# Patient Record
Sex: Male | Born: 1940 | Race: White | Hispanic: No | Marital: Married | Smoking: Former smoker
Health system: Southern US, Community
[De-identification: ages and names within clinical notes are randomized; demographics above are authoritative.]

## PROBLEM LIST (undated history)

## (undated) DIAGNOSIS — Z860100 Personal history of colon polyps, unspecified: Secondary | ICD-10-CM

## (undated) DIAGNOSIS — D751 Secondary polycythemia: Secondary | ICD-10-CM

## (undated) DIAGNOSIS — Q796 Ehlers-Danlos syndrome, unspecified: Secondary | ICD-10-CM

## (undated) DIAGNOSIS — E349 Endocrine disorder, unspecified: Secondary | ICD-10-CM

## (undated) HISTORY — DX: Personal history of colon polyps, unspecified: Z86.0100

## (undated) HISTORY — PX: SKIN LESION EXCISION: SHX2412

## (undated) HISTORY — DX: Personal history of colonic polyps: Z86.010

## (undated) HISTORY — DX: Endocrine disorder, unspecified: E34.9

## (undated) HISTORY — DX: Ehlers-Danlos syndrome, unspecified: Q79.60

## (undated) HISTORY — DX: Secondary polycythemia: D75.1

---

## 1986-01-11 HISTORY — PX: CHOLECYSTECTOMY: SHX55

## 1990-01-11 HISTORY — PX: APPENDECTOMY: SHX54

## 2011-03-02 DIAGNOSIS — E782 Mixed hyperlipidemia: Secondary | ICD-10-CM | POA: Diagnosis not present

## 2011-03-02 DIAGNOSIS — N529 Male erectile dysfunction, unspecified: Secondary | ICD-10-CM | POA: Diagnosis not present

## 2011-03-02 DIAGNOSIS — Q796 Ehlers-Danlos syndrome, unspecified: Secondary | ICD-10-CM | POA: Diagnosis not present

## 2011-03-02 DIAGNOSIS — E291 Testicular hypofunction: Secondary | ICD-10-CM | POA: Diagnosis not present

## 2011-03-02 DIAGNOSIS — T466X5A Adverse effect of antihyperlipidemic and antiarteriosclerotic drugs, initial encounter: Secondary | ICD-10-CM | POA: Diagnosis not present

## 2011-03-02 DIAGNOSIS — Z79899 Other long term (current) drug therapy: Secondary | ICD-10-CM | POA: Diagnosis not present

## 2011-03-02 DIAGNOSIS — M353 Polymyalgia rheumatica: Secondary | ICD-10-CM | POA: Diagnosis not present

## 2011-03-02 DIAGNOSIS — M79609 Pain in unspecified limb: Secondary | ICD-10-CM | POA: Diagnosis not present

## 2011-03-02 DIAGNOSIS — M2550 Pain in unspecified joint: Secondary | ICD-10-CM | POA: Diagnosis not present

## 2011-03-10 DIAGNOSIS — T466X5A Adverse effect of antihyperlipidemic and antiarteriosclerotic drugs, initial encounter: Secondary | ICD-10-CM | POA: Diagnosis not present

## 2011-03-10 DIAGNOSIS — E291 Testicular hypofunction: Secondary | ICD-10-CM | POA: Diagnosis not present

## 2011-04-21 DIAGNOSIS — B370 Candidal stomatitis: Secondary | ICD-10-CM | POA: Diagnosis not present

## 2011-04-21 DIAGNOSIS — E291 Testicular hypofunction: Secondary | ICD-10-CM | POA: Diagnosis not present

## 2011-05-10 DIAGNOSIS — K1321 Leukoplakia of oral mucosa, including tongue: Secondary | ICD-10-CM | POA: Diagnosis not present

## 2011-05-11 DIAGNOSIS — E291 Testicular hypofunction: Secondary | ICD-10-CM | POA: Diagnosis not present

## 2011-05-25 DIAGNOSIS — E291 Testicular hypofunction: Secondary | ICD-10-CM | POA: Diagnosis not present

## 2011-05-27 DIAGNOSIS — K1321 Leukoplakia of oral mucosa, including tongue: Secondary | ICD-10-CM | POA: Diagnosis not present

## 2011-05-31 DIAGNOSIS — E291 Testicular hypofunction: Secondary | ICD-10-CM | POA: Diagnosis not present

## 2011-05-31 DIAGNOSIS — Q796 Ehlers-Danlos syndrome, unspecified: Secondary | ICD-10-CM | POA: Diagnosis not present

## 2011-06-01 DIAGNOSIS — K1321 Leukoplakia of oral mucosa, including tongue: Secondary | ICD-10-CM | POA: Diagnosis not present

## 2011-06-09 DIAGNOSIS — L439 Lichen planus, unspecified: Secondary | ICD-10-CM | POA: Diagnosis not present

## 2011-07-06 DIAGNOSIS — B370 Candidal stomatitis: Secondary | ICD-10-CM | POA: Diagnosis not present

## 2011-08-17 DIAGNOSIS — L439 Lichen planus, unspecified: Secondary | ICD-10-CM | POA: Diagnosis not present

## 2011-09-14 DIAGNOSIS — Z125 Encounter for screening for malignant neoplasm of prostate: Secondary | ICD-10-CM | POA: Diagnosis not present

## 2011-09-14 DIAGNOSIS — E291 Testicular hypofunction: Secondary | ICD-10-CM | POA: Diagnosis not present

## 2011-09-14 DIAGNOSIS — E782 Mixed hyperlipidemia: Secondary | ICD-10-CM | POA: Diagnosis not present

## 2011-09-14 DIAGNOSIS — Z87891 Personal history of nicotine dependence: Secondary | ICD-10-CM | POA: Diagnosis not present

## 2011-09-14 DIAGNOSIS — M353 Polymyalgia rheumatica: Secondary | ICD-10-CM | POA: Diagnosis not present

## 2011-09-14 DIAGNOSIS — G510 Bell's palsy: Secondary | ICD-10-CM | POA: Diagnosis not present

## 2011-09-14 DIAGNOSIS — Z79899 Other long term (current) drug therapy: Secondary | ICD-10-CM | POA: Diagnosis not present

## 2011-09-14 DIAGNOSIS — Q796 Ehlers-Danlos syndrome, unspecified: Secondary | ICD-10-CM | POA: Diagnosis not present

## 2011-10-18 DIAGNOSIS — H2510 Age-related nuclear cataract, unspecified eye: Secondary | ICD-10-CM | POA: Diagnosis not present

## 2011-11-19 DIAGNOSIS — H2510 Age-related nuclear cataract, unspecified eye: Secondary | ICD-10-CM | POA: Diagnosis not present

## 2011-12-01 DIAGNOSIS — IMO0002 Reserved for concepts with insufficient information to code with codable children: Secondary | ICD-10-CM | POA: Diagnosis not present

## 2011-12-01 DIAGNOSIS — E782 Mixed hyperlipidemia: Secondary | ICD-10-CM | POA: Diagnosis not present

## 2011-12-01 DIAGNOSIS — M353 Polymyalgia rheumatica: Secondary | ICD-10-CM | POA: Diagnosis not present

## 2011-12-01 DIAGNOSIS — Z79899 Other long term (current) drug therapy: Secondary | ICD-10-CM | POA: Diagnosis not present

## 2011-12-01 DIAGNOSIS — Q796 Ehlers-Danlos syndrome, unspecified: Secondary | ICD-10-CM | POA: Diagnosis not present

## 2011-12-01 DIAGNOSIS — E291 Testicular hypofunction: Secondary | ICD-10-CM | POA: Diagnosis not present

## 2011-12-01 DIAGNOSIS — Z87891 Personal history of nicotine dependence: Secondary | ICD-10-CM | POA: Diagnosis not present

## 2011-12-01 DIAGNOSIS — Z23 Encounter for immunization: Secondary | ICD-10-CM | POA: Diagnosis not present

## 2011-12-15 DIAGNOSIS — H2510 Age-related nuclear cataract, unspecified eye: Secondary | ICD-10-CM | POA: Diagnosis not present

## 2012-01-03 DIAGNOSIS — Z125 Encounter for screening for malignant neoplasm of prostate: Secondary | ICD-10-CM | POA: Diagnosis not present

## 2012-01-03 DIAGNOSIS — E291 Testicular hypofunction: Secondary | ICD-10-CM | POA: Diagnosis not present

## 2012-01-03 DIAGNOSIS — Z79899 Other long term (current) drug therapy: Secondary | ICD-10-CM | POA: Diagnosis not present

## 2012-01-03 DIAGNOSIS — Z87891 Personal history of nicotine dependence: Secondary | ICD-10-CM | POA: Diagnosis not present

## 2012-01-03 DIAGNOSIS — M353 Polymyalgia rheumatica: Secondary | ICD-10-CM | POA: Diagnosis not present

## 2012-01-03 DIAGNOSIS — R945 Abnormal results of liver function studies: Secondary | ICD-10-CM | POA: Diagnosis not present

## 2012-01-03 DIAGNOSIS — Q796 Ehlers-Danlos syndrome, unspecified: Secondary | ICD-10-CM | POA: Diagnosis not present

## 2012-01-03 DIAGNOSIS — E782 Mixed hyperlipidemia: Secondary | ICD-10-CM | POA: Diagnosis not present

## 2012-01-10 DIAGNOSIS — Z6825 Body mass index (BMI) 25.0-25.9, adult: Secondary | ICD-10-CM | POA: Diagnosis not present

## 2012-01-10 DIAGNOSIS — D45 Polycythemia vera: Secondary | ICD-10-CM | POA: Diagnosis not present

## 2012-01-10 DIAGNOSIS — Z79899 Other long term (current) drug therapy: Secondary | ICD-10-CM | POA: Diagnosis not present

## 2012-01-10 DIAGNOSIS — Z87891 Personal history of nicotine dependence: Secondary | ICD-10-CM | POA: Diagnosis not present

## 2012-01-20 DIAGNOSIS — H04129 Dry eye syndrome of unspecified lacrimal gland: Secondary | ICD-10-CM | POA: Diagnosis not present

## 2012-01-20 DIAGNOSIS — H2000 Unspecified acute and subacute iridocyclitis: Secondary | ICD-10-CM | POA: Diagnosis not present

## 2012-01-28 DIAGNOSIS — H2000 Unspecified acute and subacute iridocyclitis: Secondary | ICD-10-CM | POA: Diagnosis not present

## 2012-04-12 DIAGNOSIS — E291 Testicular hypofunction: Secondary | ICD-10-CM | POA: Diagnosis not present

## 2012-04-12 DIAGNOSIS — L989 Disorder of the skin and subcutaneous tissue, unspecified: Secondary | ICD-10-CM | POA: Diagnosis not present

## 2012-04-12 DIAGNOSIS — Z79899 Other long term (current) drug therapy: Secondary | ICD-10-CM | POA: Diagnosis not present

## 2012-04-12 DIAGNOSIS — Z6825 Body mass index (BMI) 25.0-25.9, adult: Secondary | ICD-10-CM | POA: Diagnosis not present

## 2012-04-12 DIAGNOSIS — G510 Bell's palsy: Secondary | ICD-10-CM | POA: Diagnosis not present

## 2012-04-12 DIAGNOSIS — E782 Mixed hyperlipidemia: Secondary | ICD-10-CM | POA: Diagnosis not present

## 2012-04-12 DIAGNOSIS — Z87891 Personal history of nicotine dependence: Secondary | ICD-10-CM | POA: Diagnosis not present

## 2012-04-19 DIAGNOSIS — D485 Neoplasm of uncertain behavior of skin: Secondary | ICD-10-CM | POA: Diagnosis not present

## 2012-04-19 DIAGNOSIS — C4441 Basal cell carcinoma of skin of scalp and neck: Secondary | ICD-10-CM | POA: Diagnosis not present

## 2012-04-26 DIAGNOSIS — C4441 Basal cell carcinoma of skin of scalp and neck: Secondary | ICD-10-CM | POA: Diagnosis not present

## 2012-04-26 DIAGNOSIS — C44111 Basal cell carcinoma of skin of unspecified eyelid, including canthus: Secondary | ICD-10-CM | POA: Diagnosis not present

## 2012-09-07 DIAGNOSIS — E291 Testicular hypofunction: Secondary | ICD-10-CM | POA: Diagnosis not present

## 2012-09-25 ENCOUNTER — Ambulatory Visit: Payer: Self-pay | Admitting: Internal Medicine

## 2012-09-25 DIAGNOSIS — Z79899 Other long term (current) drug therapy: Secondary | ICD-10-CM | POA: Diagnosis not present

## 2012-09-25 DIAGNOSIS — Z7982 Long term (current) use of aspirin: Secondary | ICD-10-CM | POA: Diagnosis not present

## 2012-09-25 DIAGNOSIS — D509 Iron deficiency anemia, unspecified: Secondary | ICD-10-CM | POA: Diagnosis not present

## 2012-09-25 DIAGNOSIS — D751 Secondary polycythemia: Secondary | ICD-10-CM | POA: Diagnosis not present

## 2012-09-25 DIAGNOSIS — Q796 Ehlers-Danlos syndrome, unspecified: Secondary | ICD-10-CM | POA: Diagnosis not present

## 2012-09-27 ENCOUNTER — Ambulatory Visit: Payer: Self-pay | Admitting: Internal Medicine

## 2012-09-27 LAB — IRON AND TIBC
Iron Bind.Cap.(Total): 267 ug/dL (ref 250–450)
Iron Saturation: 52 %
Iron: 139 ug/dL (ref 65–175)
Unbound Iron-Bind.Cap.: 128 ug/dL

## 2012-09-27 LAB — CBC CANCER CENTER
Basophil %: 0.6 %
Eosinophil #: 0.5 x10 3/mm (ref 0.0–0.7)
Eosinophil %: 6.4 %
HCT: 55.1 % — ABNORMAL HIGH (ref 40.0–52.0)
HGB: 18.9 g/dL — ABNORMAL HIGH (ref 13.0–18.0)
Lymphocyte #: 1.4 x10 3/mm (ref 1.0–3.6)
Lymphocyte %: 19.2 %
MCH: 32 pg (ref 26.0–34.0)
MCHC: 34.2 g/dL (ref 32.0–36.0)
MCV: 93 fL (ref 80–100)
Monocyte #: 0.9 x10 3/mm (ref 0.2–1.0)
Monocyte %: 11.5 %
Neutrophil %: 62.3 %
Platelet: 168 x10 3/mm (ref 150–440)
RDW: 13.5 % (ref 11.5–14.5)
WBC: 7.5 x10 3/mm (ref 3.8–10.6)

## 2012-09-27 LAB — FERRITIN: Ferritin (ARMC): 59 ng/mL (ref 8–388)

## 2012-10-02 LAB — CANCER CENTER HEMATOCRIT: HCT: 51.2 % (ref 40.0–52.0)

## 2012-10-11 ENCOUNTER — Ambulatory Visit: Payer: Self-pay | Admitting: Internal Medicine

## 2012-10-11 DIAGNOSIS — Z79899 Other long term (current) drug therapy: Secondary | ICD-10-CM | POA: Diagnosis not present

## 2012-10-11 DIAGNOSIS — Q796 Ehlers-Danlos syndrome, unspecified: Secondary | ICD-10-CM | POA: Diagnosis not present

## 2012-10-11 DIAGNOSIS — Z7982 Long term (current) use of aspirin: Secondary | ICD-10-CM | POA: Diagnosis not present

## 2012-10-11 DIAGNOSIS — D751 Secondary polycythemia: Secondary | ICD-10-CM | POA: Diagnosis not present

## 2012-10-16 DIAGNOSIS — Z79899 Other long term (current) drug therapy: Secondary | ICD-10-CM | POA: Diagnosis not present

## 2012-10-16 DIAGNOSIS — D751 Secondary polycythemia: Secondary | ICD-10-CM | POA: Diagnosis not present

## 2012-10-16 LAB — CBC CANCER CENTER
Basophil %: 0.5 %
HCT: 48.6 % (ref 40.0–52.0)
Lymphocyte #: 1.3 x10 3/mm (ref 1.0–3.6)
Lymphocyte %: 22.8 %
MCHC: 34.9 g/dL (ref 32.0–36.0)
MCV: 95 fL (ref 80–100)
Monocyte #: 0.6 x10 3/mm (ref 0.2–1.0)
Monocyte %: 10.1 %
Neutrophil #: 3.5 x10 3/mm (ref 1.4–6.5)
Platelet: 168 x10 3/mm (ref 150–440)
RDW: 14 % (ref 11.5–14.5)

## 2012-10-16 LAB — CREATININE, SERUM
Creatinine: 1.1 mg/dL (ref 0.60–1.30)
EGFR (African American): >60
EGFR (Non-African Amer.): >60

## 2012-10-16 LAB — HEPATIC FUNCTION PANEL A (ARMC)
Albumin: 3.8 g/dL (ref 3.4–5.0)
Bilirubin, Direct: 0.1 mg/dL (ref 0.00–0.20)
Bilirubin,Total: 0.6 mg/dL (ref 0.2–1.0)
SGPT (ALT): 27 U/L (ref 12–78)
Total Protein: 7.1 g/dL (ref 6.4–8.2)

## 2012-10-19 DIAGNOSIS — Z0000 Encounter for general adult medical examination without abnormal findings: Secondary | ICD-10-CM | POA: Diagnosis not present

## 2012-10-19 DIAGNOSIS — Z86010 Personal history of colonic polyps: Secondary | ICD-10-CM | POA: Diagnosis not present

## 2012-10-19 DIAGNOSIS — Z23 Encounter for immunization: Secondary | ICD-10-CM | POA: Diagnosis not present

## 2012-11-11 ENCOUNTER — Ambulatory Visit: Payer: Self-pay | Admitting: Internal Medicine

## 2012-11-11 DIAGNOSIS — D751 Secondary polycythemia: Secondary | ICD-10-CM | POA: Diagnosis not present

## 2012-11-28 LAB — CANCER CENTER HEMATOCRIT: HCT: 46.4 % (ref 40.0–52.0)

## 2012-12-11 ENCOUNTER — Ambulatory Visit: Payer: Self-pay | Admitting: Internal Medicine

## 2012-12-11 DIAGNOSIS — Q796 Ehlers-Danlos syndrome, unspecified: Secondary | ICD-10-CM | POA: Diagnosis not present

## 2012-12-11 DIAGNOSIS — Z7982 Long term (current) use of aspirin: Secondary | ICD-10-CM | POA: Diagnosis not present

## 2012-12-11 DIAGNOSIS — D509 Iron deficiency anemia, unspecified: Secondary | ICD-10-CM | POA: Diagnosis not present

## 2012-12-11 DIAGNOSIS — Z79899 Other long term (current) drug therapy: Secondary | ICD-10-CM | POA: Diagnosis not present

## 2012-12-11 DIAGNOSIS — D751 Secondary polycythemia: Secondary | ICD-10-CM | POA: Diagnosis not present

## 2012-12-19 LAB — CANCER CENTER HEMATOCRIT: HCT: 49.6 % (ref 40.0–52.0)

## 2013-01-09 LAB — FERRITIN: Ferritin (ARMC): 12 ng/mL (ref 8–388)

## 2013-01-09 LAB — IRON AND TIBC: Iron Bind.Cap.(Total): 295 ug/dL (ref 250–450)

## 2013-01-11 ENCOUNTER — Ambulatory Visit: Payer: Self-pay | Admitting: Internal Medicine

## 2013-01-11 DIAGNOSIS — Z7982 Long term (current) use of aspirin: Secondary | ICD-10-CM | POA: Diagnosis not present

## 2013-01-11 DIAGNOSIS — Z79899 Other long term (current) drug therapy: Secondary | ICD-10-CM | POA: Diagnosis not present

## 2013-01-11 DIAGNOSIS — D751 Secondary polycythemia: Secondary | ICD-10-CM | POA: Diagnosis not present

## 2013-02-02 DIAGNOSIS — Z79899 Other long term (current) drug therapy: Secondary | ICD-10-CM | POA: Diagnosis not present

## 2013-02-02 DIAGNOSIS — Z136 Encounter for screening for cardiovascular disorders: Secondary | ICD-10-CM | POA: Diagnosis not present

## 2013-02-02 DIAGNOSIS — E291 Testicular hypofunction: Secondary | ICD-10-CM | POA: Diagnosis not present

## 2013-02-02 DIAGNOSIS — D751 Secondary polycythemia: Secondary | ICD-10-CM | POA: Diagnosis not present

## 2013-02-02 DIAGNOSIS — Z86010 Personal history of colonic polyps: Secondary | ICD-10-CM | POA: Diagnosis not present

## 2013-02-02 DIAGNOSIS — R030 Elevated blood-pressure reading, without diagnosis of hypertension: Secondary | ICD-10-CM | POA: Diagnosis not present

## 2013-02-02 DIAGNOSIS — Z125 Encounter for screening for malignant neoplasm of prostate: Secondary | ICD-10-CM | POA: Diagnosis not present

## 2013-02-02 DIAGNOSIS — Z0000 Encounter for general adult medical examination without abnormal findings: Secondary | ICD-10-CM | POA: Diagnosis not present

## 2013-02-02 DIAGNOSIS — Z23 Encounter for immunization: Secondary | ICD-10-CM | POA: Diagnosis not present

## 2013-02-02 LAB — CBC CANCER CENTER
Basophil #: 0 x10 3/mm (ref 0.0–0.1)
Basophil %: 0.6 %
EOS PCT: 4.3 %
Eosinophil #: 0.3 x10 3/mm (ref 0.0–0.7)
HCT: 49.8 % (ref 40.0–52.0)
HGB: 16.5 g/dL (ref 13.0–18.0)
LYMPHS PCT: 19.5 %
Lymphocyte #: 1.2 x10 3/mm (ref 1.0–3.6)
MCH: 30 pg (ref 26.0–34.0)
MCHC: 33 g/dL (ref 32.0–36.0)
MCV: 91 fL (ref 80–100)
Monocyte #: 0.7 x10 3/mm (ref 0.2–1.0)
Monocyte %: 10.9 %
Neutrophil #: 4.1 x10 3/mm (ref 1.4–6.5)
Neutrophil %: 64.7 %
Platelet: 180 x10 3/mm (ref 150–440)
RBC: 5.49 10*6/uL (ref 4.40–5.90)
RDW: 13.3 % (ref 11.5–14.5)
WBC: 6.3 x10 3/mm (ref 3.8–10.6)

## 2013-02-11 ENCOUNTER — Ambulatory Visit: Payer: Self-pay | Admitting: Internal Medicine

## 2013-02-11 ENCOUNTER — Ambulatory Visit: Payer: Self-pay

## 2013-03-15 ENCOUNTER — Ambulatory Visit: Payer: Self-pay | Admitting: Internal Medicine

## 2013-03-15 DIAGNOSIS — D751 Secondary polycythemia: Secondary | ICD-10-CM | POA: Diagnosis not present

## 2013-03-16 DIAGNOSIS — D751 Secondary polycythemia: Secondary | ICD-10-CM | POA: Diagnosis not present

## 2013-03-16 LAB — CANCER CENTER HEMATOCRIT: HCT: 48.8 % (ref 40.0–52.0)

## 2013-03-29 ENCOUNTER — Ambulatory Visit: Payer: Self-pay | Admitting: Gastroenterology

## 2013-03-29 DIAGNOSIS — K5730 Diverticulosis of large intestine without perforation or abscess without bleeding: Secondary | ICD-10-CM | POA: Diagnosis not present

## 2013-03-29 DIAGNOSIS — Z7982 Long term (current) use of aspirin: Secondary | ICD-10-CM | POA: Diagnosis not present

## 2013-03-29 DIAGNOSIS — Z1211 Encounter for screening for malignant neoplasm of colon: Secondary | ICD-10-CM | POA: Diagnosis not present

## 2013-03-29 DIAGNOSIS — Z860100 Personal history of colon polyps, unspecified: Secondary | ICD-10-CM | POA: Diagnosis not present

## 2013-03-29 DIAGNOSIS — D126 Benign neoplasm of colon, unspecified: Secondary | ICD-10-CM | POA: Diagnosis not present

## 2013-03-29 DIAGNOSIS — Z87891 Personal history of nicotine dependence: Secondary | ICD-10-CM | POA: Diagnosis not present

## 2013-03-29 DIAGNOSIS — Z79899 Other long term (current) drug therapy: Secondary | ICD-10-CM | POA: Diagnosis not present

## 2013-04-02 DIAGNOSIS — D751 Secondary polycythemia: Secondary | ICD-10-CM | POA: Diagnosis not present

## 2013-04-02 LAB — CANCER CENTER HEMATOCRIT: HCT: 46.3 % (ref 40.0–52.0)

## 2013-04-11 ENCOUNTER — Ambulatory Visit: Payer: Self-pay | Admitting: Internal Medicine

## 2013-04-11 DIAGNOSIS — D751 Secondary polycythemia: Secondary | ICD-10-CM | POA: Diagnosis not present

## 2013-04-13 LAB — CANCER CENTER HEMATOCRIT: HCT: 46.2 % (ref 40.0–52.0)

## 2013-04-27 LAB — CANCER CENTER HEMATOCRIT: HCT: 44.8 % (ref 40.0–52.0)

## 2013-05-10 DIAGNOSIS — E7800 Pure hypercholesterolemia, unspecified: Secondary | ICD-10-CM | POA: Diagnosis not present

## 2013-05-10 DIAGNOSIS — Z23 Encounter for immunization: Secondary | ICD-10-CM | POA: Diagnosis not present

## 2013-05-10 DIAGNOSIS — I10 Essential (primary) hypertension: Secondary | ICD-10-CM | POA: Diagnosis not present

## 2013-05-10 DIAGNOSIS — E291 Testicular hypofunction: Secondary | ICD-10-CM | POA: Diagnosis not present

## 2013-05-11 ENCOUNTER — Ambulatory Visit: Payer: Self-pay | Admitting: Internal Medicine

## 2013-05-11 DIAGNOSIS — Z79899 Other long term (current) drug therapy: Secondary | ICD-10-CM | POA: Diagnosis not present

## 2013-05-11 DIAGNOSIS — D751 Secondary polycythemia: Secondary | ICD-10-CM | POA: Diagnosis not present

## 2013-05-11 DIAGNOSIS — Z7982 Long term (current) use of aspirin: Secondary | ICD-10-CM | POA: Diagnosis not present

## 2013-05-11 LAB — CANCER CENTER HEMATOCRIT: HCT: 44.4 % (ref 40.0–52.0)

## 2013-05-25 LAB — IRON AND TIBC
Iron Bind.Cap.(Total): 293 ug/dL (ref 250–450)
Iron Saturation: 23 %
Iron: 66 ug/dL (ref 65–175)
UNBOUND IRON-BIND. CAP.: 227 ug/dL

## 2013-05-25 LAB — FERRITIN: Ferritin (ARMC): 9 ng/mL (ref 8–388)

## 2013-05-25 LAB — CANCER CENTER HEMATOCRIT: HCT: 43.4 % (ref 40.0–52.0)

## 2013-06-11 ENCOUNTER — Ambulatory Visit: Payer: Self-pay | Admitting: Internal Medicine

## 2013-06-11 DIAGNOSIS — Z79899 Other long term (current) drug therapy: Secondary | ICD-10-CM | POA: Diagnosis not present

## 2013-06-11 DIAGNOSIS — D751 Secondary polycythemia: Secondary | ICD-10-CM | POA: Diagnosis not present

## 2013-06-11 DIAGNOSIS — Q796 Ehlers-Danlos syndrome, unspecified: Secondary | ICD-10-CM | POA: Diagnosis not present

## 2013-06-11 DIAGNOSIS — Z7982 Long term (current) use of aspirin: Secondary | ICD-10-CM | POA: Diagnosis not present

## 2013-06-15 DIAGNOSIS — D751 Secondary polycythemia: Secondary | ICD-10-CM | POA: Diagnosis not present

## 2013-06-15 DIAGNOSIS — Z79899 Other long term (current) drug therapy: Secondary | ICD-10-CM | POA: Diagnosis not present

## 2013-06-15 LAB — CBC CANCER CENTER
BASOS PCT: 0.5 %
Basophil #: 0 x10 3/mm (ref 0.0–0.1)
EOS ABS: 0.4 x10 3/mm (ref 0.0–0.7)
EOS PCT: 4.9 %
HCT: 47.7 % (ref 40.0–52.0)
HGB: 15.7 g/dL (ref 13.0–18.0)
LYMPHS ABS: 1.3 x10 3/mm (ref 1.0–3.6)
LYMPHS PCT: 17.2 %
MCH: 30.6 pg (ref 26.0–34.0)
MCHC: 33 g/dL (ref 32.0–36.0)
MCV: 93 fL (ref 80–100)
MONOS PCT: 10 %
Monocyte #: 0.8 x10 3/mm (ref 0.2–1.0)
NEUTROS ABS: 5.3 x10 3/mm (ref 1.4–6.5)
Neutrophil %: 67.4 %
Platelet: 164 x10 3/mm (ref 150–440)
RBC: 5.15 10*6/uL (ref 4.40–5.90)
RDW: 13.9 % (ref 11.5–14.5)
WBC: 7.8 x10 3/mm (ref 3.8–10.6)

## 2013-07-11 ENCOUNTER — Ambulatory Visit: Payer: Self-pay | Admitting: Internal Medicine

## 2013-07-11 DIAGNOSIS — Z79899 Other long term (current) drug therapy: Secondary | ICD-10-CM | POA: Diagnosis not present

## 2013-07-11 DIAGNOSIS — D751 Secondary polycythemia: Secondary | ICD-10-CM | POA: Diagnosis not present

## 2013-07-31 LAB — CANCER CENTER HEMATOCRIT: HCT: 51.6 % (ref 40.0–52.0)

## 2013-08-11 ENCOUNTER — Ambulatory Visit: Payer: Self-pay | Admitting: Internal Medicine

## 2013-08-11 DIAGNOSIS — D751 Secondary polycythemia: Secondary | ICD-10-CM | POA: Diagnosis not present

## 2013-08-11 DIAGNOSIS — Z79899 Other long term (current) drug therapy: Secondary | ICD-10-CM | POA: Diagnosis not present

## 2013-08-21 DIAGNOSIS — H43819 Vitreous degeneration, unspecified eye: Secondary | ICD-10-CM | POA: Diagnosis not present

## 2013-09-07 LAB — CANCER CENTER HEMATOCRIT: HCT: 48.9 % (ref 40.0–52.0)

## 2013-09-11 ENCOUNTER — Ambulatory Visit: Payer: Self-pay | Admitting: Internal Medicine

## 2013-10-18 ENCOUNTER — Ambulatory Visit: Payer: Self-pay | Admitting: Internal Medicine

## 2013-10-18 DIAGNOSIS — Z79899 Other long term (current) drug therapy: Secondary | ICD-10-CM | POA: Diagnosis not present

## 2013-10-18 DIAGNOSIS — D751 Secondary polycythemia: Secondary | ICD-10-CM | POA: Diagnosis not present

## 2013-10-19 DIAGNOSIS — Z79899 Other long term (current) drug therapy: Secondary | ICD-10-CM | POA: Diagnosis not present

## 2013-10-19 DIAGNOSIS — D751 Secondary polycythemia: Secondary | ICD-10-CM | POA: Diagnosis not present

## 2013-10-19 LAB — CANCER CENTER HEMATOCRIT: HCT: 48.8 % (ref 40.0–52.0)

## 2013-11-11 ENCOUNTER — Ambulatory Visit: Payer: Self-pay | Admitting: Internal Medicine

## 2013-11-11 DIAGNOSIS — D751 Secondary polycythemia: Secondary | ICD-10-CM | POA: Diagnosis not present

## 2013-11-27 DIAGNOSIS — Z23 Encounter for immunization: Secondary | ICD-10-CM | POA: Diagnosis not present

## 2013-11-30 LAB — CANCER CENTER HEMATOCRIT: HCT: 46.9 % (ref 40.0–52.0)

## 2013-12-04 DIAGNOSIS — J019 Acute sinusitis, unspecified: Secondary | ICD-10-CM | POA: Diagnosis not present

## 2013-12-11 ENCOUNTER — Ambulatory Visit: Payer: Self-pay | Admitting: Internal Medicine

## 2013-12-11 DIAGNOSIS — D751 Secondary polycythemia: Secondary | ICD-10-CM | POA: Diagnosis not present

## 2013-12-11 DIAGNOSIS — E291 Testicular hypofunction: Secondary | ICD-10-CM | POA: Diagnosis not present

## 2013-12-11 DIAGNOSIS — Z79899 Other long term (current) drug therapy: Secondary | ICD-10-CM | POA: Diagnosis not present

## 2014-01-10 LAB — IRON AND TIBC
IRON BIND. CAP.(TOTAL): 285 ug/dL (ref 250–450)
IRON SATURATION: 47 %
Iron: 135 ug/dL (ref 65–175)
UNBOUND IRON-BIND. CAP.: 150 ug/dL

## 2014-01-10 LAB — FERRITIN: Ferritin (ARMC): 14 ng/mL (ref 8–388)

## 2014-01-10 LAB — CANCER CENTER HEMATOCRIT: HCT: 47.8 % (ref 40.0–52.0)

## 2014-01-11 ENCOUNTER — Ambulatory Visit: Payer: Self-pay | Admitting: Internal Medicine

## 2014-03-15 ENCOUNTER — Ambulatory Visit
Admit: 2014-03-15 | Disposition: A | Discharge status: Home / Self Care | Payer: Self-pay | Attending: Internal Medicine | Admitting: Internal Medicine

## 2014-03-15 DIAGNOSIS — D751 Secondary polycythemia: Secondary | ICD-10-CM | POA: Diagnosis not present

## 2014-03-19 ENCOUNTER — Ambulatory Visit: Payer: Self-pay | Admitting: Family Medicine

## 2014-03-19 DIAGNOSIS — S79921A Unspecified injury of right thigh, initial encounter: Secondary | ICD-10-CM | POA: Diagnosis not present

## 2014-03-19 DIAGNOSIS — L989 Disorder of the skin and subcutaneous tissue, unspecified: Secondary | ICD-10-CM | POA: Diagnosis not present

## 2014-03-19 DIAGNOSIS — M79604 Pain in right leg: Secondary | ICD-10-CM | POA: Diagnosis not present

## 2014-03-19 DIAGNOSIS — S8011XA Contusion of right lower leg, initial encounter: Secondary | ICD-10-CM | POA: Diagnosis not present

## 2014-03-28 DIAGNOSIS — C44719 Basal cell carcinoma of skin of left lower limb, including hip: Secondary | ICD-10-CM | POA: Diagnosis not present

## 2014-03-28 DIAGNOSIS — D485 Neoplasm of uncertain behavior of skin: Secondary | ICD-10-CM | POA: Diagnosis not present

## 2014-03-28 DIAGNOSIS — Z85828 Personal history of other malignant neoplasm of skin: Secondary | ICD-10-CM | POA: Diagnosis not present

## 2014-04-04 DIAGNOSIS — L905 Scar conditions and fibrosis of skin: Secondary | ICD-10-CM | POA: Diagnosis not present

## 2014-04-04 DIAGNOSIS — C44719 Basal cell carcinoma of skin of left lower limb, including hip: Secondary | ICD-10-CM | POA: Diagnosis not present

## 2014-04-12 ENCOUNTER — Ambulatory Visit
Admit: 2014-04-12 | Disposition: A | Discharge status: Home / Self Care | Payer: Self-pay | Attending: Internal Medicine | Admitting: Internal Medicine

## 2014-04-12 DIAGNOSIS — D751 Secondary polycythemia: Secondary | ICD-10-CM | POA: Diagnosis not present

## 2014-04-12 DIAGNOSIS — Z7982 Long term (current) use of aspirin: Secondary | ICD-10-CM | POA: Diagnosis not present

## 2014-04-12 DIAGNOSIS — Z87891 Personal history of nicotine dependence: Secondary | ICD-10-CM | POA: Diagnosis not present

## 2014-04-15 DIAGNOSIS — I10 Essential (primary) hypertension: Secondary | ICD-10-CM | POA: Diagnosis not present

## 2014-04-15 DIAGNOSIS — Z125 Encounter for screening for malignant neoplasm of prostate: Secondary | ICD-10-CM | POA: Diagnosis not present

## 2014-04-15 DIAGNOSIS — D751 Secondary polycythemia: Secondary | ICD-10-CM | POA: Diagnosis not present

## 2014-04-15 DIAGNOSIS — Z0000 Encounter for general adult medical examination without abnormal findings: Secondary | ICD-10-CM | POA: Diagnosis not present

## 2014-04-15 DIAGNOSIS — E291 Testicular hypofunction: Secondary | ICD-10-CM | POA: Diagnosis not present

## 2014-04-15 DIAGNOSIS — E780 Pure hypercholesterolemia: Secondary | ICD-10-CM | POA: Diagnosis not present

## 2014-04-26 DIAGNOSIS — D751 Secondary polycythemia: Secondary | ICD-10-CM | POA: Diagnosis not present

## 2014-04-26 DIAGNOSIS — Z87891 Personal history of nicotine dependence: Secondary | ICD-10-CM | POA: Diagnosis not present

## 2014-04-26 DIAGNOSIS — Z7982 Long term (current) use of aspirin: Secondary | ICD-10-CM | POA: Diagnosis not present

## 2014-04-26 LAB — CBC CANCER CENTER
Basophil #: 0 x10 3/mm (ref 0.0–0.1)
Basophil %: 0.4 %
EOS PCT: 5.9 %
Eosinophil #: 0.4 x10 3/mm (ref 0.0–0.7)
HCT: 48.3 % (ref 40.0–52.0)
HGB: 15.9 g/dL (ref 13.0–18.0)
LYMPHS ABS: 1.4 x10 3/mm (ref 1.0–3.6)
Lymphocyte %: 21.1 %
MCH: 31.1 pg (ref 26.0–34.0)
MCHC: 33 g/dL (ref 32.0–36.0)
MCV: 94 fL (ref 80–100)
Monocyte #: 0.8 x10 3/mm (ref 0.2–1.0)
Monocyte %: 12.4 %
Neutrophil #: 3.9 x10 3/mm (ref 1.4–6.5)
Neutrophil %: 60.2 %
PLATELETS: 163 x10 3/mm (ref 150–440)
RBC: 5.12 10*6/uL (ref 4.40–5.90)
RDW: 14.3 % (ref 11.5–14.5)
WBC: 6.4 x10 3/mm (ref 3.8–10.6)

## 2014-04-29 DIAGNOSIS — Z85828 Personal history of other malignant neoplasm of skin: Secondary | ICD-10-CM | POA: Diagnosis not present

## 2014-04-29 DIAGNOSIS — D225 Melanocytic nevi of trunk: Secondary | ICD-10-CM | POA: Diagnosis not present

## 2014-04-29 DIAGNOSIS — L821 Other seborrheic keratosis: Secondary | ICD-10-CM | POA: Diagnosis not present

## 2014-05-29 DIAGNOSIS — I10 Essential (primary) hypertension: Secondary | ICD-10-CM | POA: Diagnosis not present

## 2014-05-29 DIAGNOSIS — Z1389 Encounter for screening for other disorder: Secondary | ICD-10-CM | POA: Diagnosis not present

## 2014-05-29 DIAGNOSIS — D751 Secondary polycythemia: Secondary | ICD-10-CM | POA: Diagnosis not present

## 2014-05-29 DIAGNOSIS — E291 Testicular hypofunction: Secondary | ICD-10-CM | POA: Diagnosis not present

## 2014-05-29 DIAGNOSIS — Z0000 Encounter for general adult medical examination without abnormal findings: Secondary | ICD-10-CM | POA: Diagnosis not present

## 2014-06-07 ENCOUNTER — Other Ambulatory Visit: Payer: Self-pay

## 2014-06-20 ENCOUNTER — Other Ambulatory Visit: Payer: Self-pay

## 2014-06-20 DIAGNOSIS — D751 Secondary polycythemia: Secondary | ICD-10-CM

## 2014-06-21 ENCOUNTER — Inpatient Hospital Stay: Payer: Medicare Other | Attending: Internal Medicine

## 2014-06-21 ENCOUNTER — Ambulatory Visit: Payer: Medicare Other

## 2014-06-21 DIAGNOSIS — D751 Secondary polycythemia: Secondary | ICD-10-CM | POA: Diagnosis not present

## 2014-06-21 LAB — HEMATOCRIT: HCT: 50.2 % (ref 40.0–52.0)

## 2014-06-23 ENCOUNTER — Telehealth: Payer: Self-pay | Admitting: Family Medicine

## 2014-06-23 DIAGNOSIS — I10 Essential (primary) hypertension: Secondary | ICD-10-CM

## 2014-06-23 NOTE — Telephone Encounter (Signed)
error 

## 2014-07-01 NOTE — Telephone Encounter (Signed)
Please advise patient we need to check renal panel since starting back on lisinopril in May. Please see how BP has been doing since restarting medication. We can check his BP when he picks up labs slip if he likes. Lab order is ready to pick up at front desk. He does not need to be fasting.

## 2014-07-02 DIAGNOSIS — I10 Essential (primary) hypertension: Secondary | ICD-10-CM | POA: Diagnosis not present

## 2014-07-02 NOTE — Telephone Encounter (Signed)
Pt advised as directed below.  He has not check his blood pressure at home, because he says his cuff is not working properly.  However he was recently [facility] the cancer center and his blood pressure was.  132/83  Thanks,   -Patrick French

## 2014-07-02 NOTE — Telephone Encounter (Signed)
Sounds like BP is doing good. Please have him pick up lab slip at front desk this week sometime. Thanks.

## 2014-07-03 LAB — RENAL FUNCTION PANEL
Albumin: 4.3 g/dL (ref 3.5–4.8)
BUN/Creatinine Ratio: 12 (ref 10–22)
BUN: 12 mg/dL (ref 8–27)
CALCIUM: 9.3 mg/dL (ref 8.6–10.2)
CHLORIDE: 101 mmol/L (ref 97–108)
CO2: 25 mmol/L (ref 18–29)
Creatinine, Ser: 0.98 mg/dL (ref 0.76–1.27)
GFR, EST AFRICAN AMERICAN: 87 mL/min/{1.73_m2} (ref >59)
GFR, EST NON AFRICAN AMERICAN: 76 mL/min/{1.73_m2} (ref >59)
Glucose: 79 mg/dL (ref 65–99)
POTASSIUM: 4.7 mmol/L (ref 3.5–5.2)
Phosphorus: 3.6 mg/dL (ref 2.5–4.5)
Sodium: 141 mmol/L (ref 134–144)

## 2014-07-05 ENCOUNTER — Encounter: Payer: Self-pay | Admitting: Family Medicine

## 2014-07-05 ENCOUNTER — Ambulatory Visit (INDEPENDENT_AMBULATORY_CARE_PROVIDER_SITE_OTHER): Payer: Medicare Other | Admitting: Family Medicine

## 2014-07-05 VITALS — BP 110/80 | HR 108 | Temp 99.3°F | Resp 20 | Ht 74.0 in | Wt 188.0 lb

## 2014-07-05 DIAGNOSIS — J309 Allergic rhinitis, unspecified: Secondary | ICD-10-CM | POA: Insufficient documentation

## 2014-07-05 DIAGNOSIS — E7800 Pure hypercholesterolemia, unspecified: Secondary | ICD-10-CM | POA: Insufficient documentation

## 2014-07-05 DIAGNOSIS — A938 Other specified arthropod-borne viral fevers: Secondary | ICD-10-CM | POA: Diagnosis not present

## 2014-07-05 DIAGNOSIS — Z86010 Personal history of colonic polyps: Secondary | ICD-10-CM | POA: Insufficient documentation

## 2014-07-05 DIAGNOSIS — E291 Testicular hypofunction: Secondary | ICD-10-CM | POA: Insufficient documentation

## 2014-07-05 DIAGNOSIS — Q828 Other specified congenital malformations of skin: Secondary | ICD-10-CM | POA: Insufficient documentation

## 2014-07-05 DIAGNOSIS — Q796 Ehlers-Danlos syndrome, unspecified: Secondary | ICD-10-CM

## 2014-07-05 DIAGNOSIS — D751 Secondary polycythemia: Secondary | ICD-10-CM | POA: Insufficient documentation

## 2014-07-05 MED ORDER — DOXYCYCLINE HYCLATE 100 MG PO TABS: 100.0000 mg | ORAL_TABLET | Freq: Two times a day (BID) | ORAL | Status: DC

## 2014-07-05 NOTE — Progress Notes (Signed)
Subjective:     Patient ID: Kalil Woessner, male   DOB: 1940-07-07, 74 y.o.   MRN: [id number]  HPI  Chief Complaint  Patient presents with  . Insect Bite    Tick bite on June 9. Pt has fever, c/o joint/muscle pain, chills and sweats, "slight" abdominal discomfort, and being unsteady on feet.  Reports he is drinking more fluids.   Review of Systems  HENT: Positive for congestion (minimal).   Respiratory: Negative for cough.   Genitourinary: Negative for dysuria.       Objective:   Physical Exam  Constitutional: He appears well-developed and well-nourished. No distress.  Pulmonary/Chest: Breath sounds normal.  Skin:  Tick bite on left posterior thigh with mild inflammatory flare        Assessment:    1. Tick fever - doxycycline (VIBRA-TABS) 100 MG tablet; Take 1 tablet (100 mg total) by mouth 2 (two) times daily.  Dispense: 20 tablet; Refill: 0    Plan:   F/u with primary M.D., Dr. Caryn Section, in 48 hours if not improving.

## 2014-07-05 NOTE — Patient Instructions (Signed)
Follow up with Dr. Caryn Section next week if not improving after 48 hours.

## 2014-07-09 ENCOUNTER — Ambulatory Visit (INDEPENDENT_AMBULATORY_CARE_PROVIDER_SITE_OTHER): Payer: Medicare Other | Admitting: Family Medicine

## 2014-07-09 ENCOUNTER — Encounter: Payer: Self-pay | Admitting: Family Medicine

## 2014-07-09 VITALS — BP 110/72 | HR 68 | Temp 97.9°F | Resp 16 | Ht 74.0 in | Wt 182.0 lb

## 2014-07-09 DIAGNOSIS — R519 Headache, unspecified: Secondary | ICD-10-CM

## 2014-07-09 DIAGNOSIS — R5383 Other fatigue: Secondary | ICD-10-CM

## 2014-07-09 DIAGNOSIS — R51 Headache: Secondary | ICD-10-CM | POA: Diagnosis not present

## 2014-07-09 DIAGNOSIS — A938 Other specified arthropod-borne viral fevers: Secondary | ICD-10-CM

## 2014-07-09 MED ORDER — AMOXICILLIN 500 MG PO CAPS: 1000.0000 mg | ORAL_CAPSULE | Freq: Two times a day (BID) | ORAL | Status: DC

## 2014-07-09 MED ORDER — NABUMETONE 500 MG PO TABS: 1000.0000 mg | ORAL_TABLET | Freq: Every day | ORAL | Status: AC

## 2014-07-09 NOTE — Progress Notes (Signed)
Patient: Patrick French Male    DOB: 1940-06-01   74 y.o.   MRN: [id number] Visit Date: 07/09/2014  Today's Provider: Lelon Huh, MD   Chief Complaint  Patient presents with  . Insect Bite    Pt following up from tick fever dx on 07/05/14   Subjective:    HPI Pt is following up for Tick fever. He states bite was on the back of his leg about June 9th. It was a large tick. He  was seen on 07/05/14 by Mariel Sleet with  fever, joint pains and fatigue. He was started on Doxycycline. He reports that the fever resolved in about 2 days but he still feels bad, has no energy, joint pains are not better. He is not sleeping and he has had a headache for 5 days.     Previous Medications   ASPIRIN 81 MG TABLET    Take by mouth.   DOXYCYCLINE (VIBRA-TABS) 100 MG TABLET    Take 1 tablet (100 mg total) by mouth 2 (two) times daily.   LISINOPRIL (PRINIVIL,ZESTRIL) 10 MG TABLET    Take by mouth.   MULTIPLE VITAMIN PO    Take by mouth.   NEEDLE, DISP, 22 G 22G X 1" MISC       NIACIN 500 MG CR CAPSULE    Take by mouth.   OMEGA-3 FATTY ACIDS (FISH OIL BURP-LESS) 1000 MG CAPS    Take by mouth.   SILDENAFIL (VIAGRA) 100 MG TABLET    Take by mouth.   TESTOSTERONE CYPIONATE (DEPOTESTOSTERONE CYPIONATE) 200 MG/ML INJECTION    Inject into the muscle.    Review of Systems  Constitutional: Positive for appetite change and fatigue.  Musculoskeletal: Positive for arthralgias.  Neurological: Positive for headaches.  See HPI   History  Substance Use Topics  . Smoking status: Former Smoker -- 2.00 packs/day for 13 years    Quit date: 01/10/1970  . Smokeless tobacco: Never Used  . Alcohol Use: Yes     Comment: occasional   Objective:   BP 110/72 mmHg  Pulse 68  Temp(Src) 97.9 F (36.6 C) (Oral)  Resp 16  Ht 6\' 2"  (1.88 m)  Wt 182 lb (82.555 kg)  BMI 23.36 kg/m2  Physical Exam  General Appearance:    Alert, cooperative, no distress  Eyes:    PERRL, conjunctiva/corneas clear, EOM's intact        Lungs:     Clear to auscultation bilaterally, respirations unlabored  Heart:    Regular rate and rhythm  Neurologic:   Awake, alert, oriented x 3. No apparent focal neurological           defect.   Skin:   About 1/2 cm healing lesion back of thigh with no surrounding erythema and no other rashes or lesions.   MS:   FROM of all joints, no swelling or erythema of joints.         Assessment & Plan:     1. Tick fever Fever resolved since starting doxycycline, but constitutional symptoms persist, and having persistent generalized headache as below.  - [place] m[place] spotted fvr abs pnl(IgG+IgM) - Lyme Ab/Western Blot Reflex - amoxicillin (AMOXIL) 500 MG capsule; Take 2 capsules (1,000 mg total) by mouth 2 (two) times daily.  Dispense: 28 capsule; Refill: 0  2. Other fatigue  - Comprehensive metabolic panel - CBC - TSH  3. Acute nonintractable headache, unspecified headache type  - nabumetone (RELAFEN) 500 MG tablet; Take 2  tablets (1,000 mg total) by mouth daily.  Dispense: 20 tablet; Refill: 0  Call if symptoms change or if not rapidly improving.      Lelon Huh, MD  [facility]

## 2014-07-10 LAB — CBC
Hematocrit: 48.5 % (ref 37.5–51.0)
Hemoglobin: 17.1 g/dL (ref 12.6–17.7)
MCH: 31.5 pg (ref 26.6–33.0)
MCHC: 35.3 g/dL (ref 31.5–35.7)
MCV: 90 fL (ref 79–97)
PLATELETS: 222 10*3/uL (ref 150–379)
RBC: 5.42 x10E6/uL (ref 4.14–5.80)
RDW: 13.9 % (ref 12.3–15.4)
WBC: 6.4 10*3/uL (ref 3.4–10.8)

## 2014-07-10 LAB — COMPREHENSIVE METABOLIC PANEL
ALT: 61 IU/L — ABNORMAL HIGH (ref 0–44)
AST: 39 IU/L (ref 0–40)
Albumin/Globulin Ratio: 1.6 (ref 1.1–2.5)
Albumin: 4 g/dL (ref 3.5–4.8)
Alkaline Phosphatase: 131 IU/L — ABNORMAL HIGH (ref 39–117)
BUN/Creatinine Ratio: 16 (ref 10–22)
BUN: 17 mg/dL (ref 8–27)
Bilirubin Total: 0.8 mg/dL (ref 0.0–1.2)
CALCIUM: 9.5 mg/dL (ref 8.6–10.2)
CO2: 25 mmol/L (ref 18–29)
CREATININE: 1.04 mg/dL (ref 0.76–1.27)
Chloride: 98 mmol/L (ref 97–108)
GFR calc Af Amer: 81 mL/min/{1.73_m2} (ref >59)
GFR calc non Af Amer: 70 mL/min/{1.73_m2} (ref >59)
GLOBULIN, TOTAL: 2.5 g/dL (ref 1.5–4.5)
Glucose: 107 mg/dL — ABNORMAL HIGH (ref 65–99)
Potassium: 4.6 mmol/L (ref 3.5–5.2)
Sodium: 139 mmol/L (ref 134–144)
TOTAL PROTEIN: 6.5 g/dL (ref 6.0–8.5)

## 2014-07-10 LAB — TSH: TSH: 1.99 u[IU]/mL (ref 0.450–4.500)

## 2014-07-11 ENCOUNTER — Telehealth: Payer: Self-pay

## 2014-07-11 DIAGNOSIS — A938 Other specified arthropod-borne viral fevers: Secondary | ICD-10-CM

## 2014-07-11 LAB — LYME, WESTERN BLOT, SERUM (REFLEXED)
IGG P18 AB.: ABSENT
IGG P28 AB.: ABSENT
IGG P30 AB.: ABSENT
IGG P45 AB.: ABSENT
IGG P58 AB.: ABSENT
IGG P93 AB.: ABSENT
IgG P23 Ab.: ABSENT
IgG P39 Ab.: ABSENT
IgG P66 Ab.: ABSENT
Lyme IgG Wb: NEGATIVE
Lyme IgM Wb: POSITIVE — AB

## 2014-07-11 LAB — LYME AB/WESTERN BLOT REFLEX
LYME DISEASE AB, QUANT, IGM: 4.46 {index} — AB (ref 0.00–0.79)
Lyme IgG/IgM Ab: 0.94 {ISR} — ABNORMAL HIGH (ref 0.00–0.90)

## 2014-07-11 LAB — ROCKY MTN SPOTTED FVR ABS PNL(IGG+IGM)
RMSF IGG: NEGATIVE
RMSF IgM: 0.28 index (ref 0.00–0.89)

## 2014-07-11 MED ORDER — AMOXICILLIN 500 MG PO CAPS: 1000.0000 mg | ORAL_CAPSULE | Freq: Two times a day (BID) | ORAL | Status: AC

## 2014-07-11 NOTE — Telephone Encounter (Signed)
Patient advised.Patient states his symptoms are starting to improve since starting Amoxicillin.  Refill sent into pharmacy.

## 2014-07-11 NOTE — Telephone Encounter (Signed)
-----   Message from Margarita Rana, MD sent at 07/11/2014  4:05 PM EDT ----- Patient with positive early lyme titers.   Please see how he is feeling on Amoxicillin. Can treat for up to 21 days.  Can call in 2 more weeks of Amoxicillin to make sure completely treated. Thanks

## 2014-07-18 ENCOUNTER — Other Ambulatory Visit: Payer: Self-pay

## 2014-07-18 DIAGNOSIS — D751 Secondary polycythemia: Secondary | ICD-10-CM

## 2014-07-19 ENCOUNTER — Inpatient Hospital Stay: Payer: Medicare Other | Attending: Internal Medicine

## 2014-07-19 ENCOUNTER — Inpatient Hospital Stay: Payer: Medicare Other

## 2014-07-19 ENCOUNTER — Ambulatory Visit: Payer: Self-pay | Admitting: Internal Medicine

## 2014-07-19 DIAGNOSIS — Z79899 Other long term (current) drug therapy: Secondary | ICD-10-CM | POA: Insufficient documentation

## 2014-07-19 DIAGNOSIS — E538 Deficiency of other specified B group vitamins: Secondary | ICD-10-CM | POA: Insufficient documentation

## 2014-07-19 DIAGNOSIS — D751 Secondary polycythemia: Secondary | ICD-10-CM

## 2014-07-19 LAB — HEMATOCRIT: HCT: 47.7 % (ref 40.0–52.0)

## 2014-08-15 ENCOUNTER — Encounter: Payer: Self-pay | Admitting: Family Medicine

## 2014-08-18 ENCOUNTER — Other Ambulatory Visit: Payer: Self-pay | Admitting: Family Medicine

## 2014-08-18 MED ORDER — LISINOPRIL 10 MG PO TABS: 10.0000 mg | ORAL_TABLET | Freq: Every day | ORAL | Status: DC

## 2014-08-30 ENCOUNTER — Inpatient Hospital Stay: Payer: Medicare Other

## 2014-09-06 ENCOUNTER — Other Ambulatory Visit: Payer: Self-pay | Admitting: *Deleted

## 2014-09-06 ENCOUNTER — Other Ambulatory Visit: Payer: Self-pay | Admitting: Internal Medicine

## 2014-09-06 ENCOUNTER — Inpatient Hospital Stay: Payer: Medicare Other

## 2014-09-06 ENCOUNTER — Inpatient Hospital Stay: Payer: Medicare Other | Attending: Internal Medicine

## 2014-09-06 ENCOUNTER — Encounter: Payer: Self-pay | Admitting: *Deleted

## 2014-09-06 VITALS — BP 144/82 | HR 87 | Temp 97.7°F

## 2014-09-06 DIAGNOSIS — Z7982 Long term (current) use of aspirin: Secondary | ICD-10-CM | POA: Insufficient documentation

## 2014-09-06 DIAGNOSIS — Z87891 Personal history of nicotine dependence: Secondary | ICD-10-CM | POA: Insufficient documentation

## 2014-09-06 DIAGNOSIS — D751 Secondary polycythemia: Secondary | ICD-10-CM

## 2014-09-06 HISTORY — DX: Secondary polycythemia: D75.1

## 2014-09-06 LAB — HEMATOCRIT: HCT: 47.4 % (ref 40.0–52.0)

## 2014-10-11 ENCOUNTER — Inpatient Hospital Stay: Payer: Medicare Other | Attending: Internal Medicine

## 2014-10-11 ENCOUNTER — Inpatient Hospital Stay: Payer: Medicare Other

## 2014-10-11 DIAGNOSIS — D751 Secondary polycythemia: Secondary | ICD-10-CM | POA: Insufficient documentation

## 2014-10-11 LAB — HEMATOCRIT: HCT: 47.4 % (ref 40.0–52.0)

## 2014-10-14 DIAGNOSIS — Z961 Presence of intraocular lens: Secondary | ICD-10-CM | POA: Diagnosis not present

## 2014-10-29 DIAGNOSIS — Z85828 Personal history of other malignant neoplasm of skin: Secondary | ICD-10-CM | POA: Diagnosis not present

## 2014-10-29 DIAGNOSIS — D225 Melanocytic nevi of trunk: Secondary | ICD-10-CM | POA: Diagnosis not present

## 2014-10-29 DIAGNOSIS — D2261 Melanocytic nevi of right upper limb, including shoulder: Secondary | ICD-10-CM | POA: Diagnosis not present

## 2014-10-29 DIAGNOSIS — D2272 Melanocytic nevi of left lower limb, including hip: Secondary | ICD-10-CM | POA: Diagnosis not present

## 2014-11-06 ENCOUNTER — Encounter: Payer: Self-pay | Admitting: Family Medicine

## 2014-11-11 ENCOUNTER — Encounter: Payer: Self-pay | Admitting: Family Medicine

## 2014-11-12 ENCOUNTER — Other Ambulatory Visit: Payer: Self-pay | Admitting: *Deleted

## 2014-11-12 NOTE — Telephone Encounter (Signed)
-----   Message -----        From: Charlton Haws        Sent: 11/11/2014   9:25 AM          To: [facility]     Subject: Non-Urgent Medical Question                            Request a flu shot.          Request a refill for testosterone cypionate 200 MG/ML injection and associated syringes and needles.          Review record for possible update of tetanus vaccine.

## 2014-11-12 NOTE — Telephone Encounter (Signed)
No record of any tetanus vaccine. Replied to pt's e-mail. Advised pt to call office to schedule nurse visit for flu shot.

## 2014-11-12 NOTE — Telephone Encounter (Signed)
Requesting refill for testosterone 200 mg/mL injection with syringes and needles?

## 2014-11-13 MED ORDER — TESTOSTERONE CYPIONATE 200 MG/ML IM SOLN: 100.0000 mg | INTRAMUSCULAR | Status: DC

## 2014-11-13 NOTE — Telephone Encounter (Signed)
Please call in testerone.

## 2014-11-13 NOTE — Telephone Encounter (Signed)
Rx called in to pharmacy. 

## 2014-11-22 ENCOUNTER — Inpatient Hospital Stay: Payer: Medicare Other

## 2014-11-22 ENCOUNTER — Inpatient Hospital Stay: Payer: Medicare Other | Attending: Internal Medicine

## 2014-11-22 VITALS — BP 131/80 | HR 84 | Temp 97.5°F | Resp 18

## 2014-11-22 DIAGNOSIS — D751 Secondary polycythemia: Secondary | ICD-10-CM | POA: Insufficient documentation

## 2014-11-22 LAB — IRON AND TIBC
Iron: 44 ug/dL — ABNORMAL LOW (ref 45–182)
SATURATION RATIOS: 16 % — AB (ref 17.9–39.5)
TIBC: 281 ug/dL (ref 250–450)
UIBC: 237 ug/dL

## 2014-11-22 LAB — HEMATOCRIT: HEMATOCRIT: 48 % (ref 40.0–52.0)

## 2014-11-22 LAB — FERRITIN: Ferritin: 12 ng/mL — ABNORMAL LOW (ref 24–336)

## 2014-12-13 ENCOUNTER — Ambulatory Visit (INDEPENDENT_AMBULATORY_CARE_PROVIDER_SITE_OTHER): Payer: Medicare Other | Admitting: Family Medicine

## 2014-12-13 ENCOUNTER — Encounter: Payer: Self-pay | Admitting: Family Medicine

## 2014-12-13 VITALS — BP 118/80 | HR 79 | Temp 98.0°F | Resp 16 | Ht 74.0 in | Wt 194.0 lb

## 2014-12-13 DIAGNOSIS — J069 Acute upper respiratory infection, unspecified: Secondary | ICD-10-CM | POA: Diagnosis not present

## 2014-12-13 MED ORDER — AMOXICILLIN 500 MG PO CAPS: 500.0000 mg | ORAL_CAPSULE | Freq: Three times a day (TID) | ORAL | Status: DC

## 2014-12-13 NOTE — Progress Notes (Signed)
Patient: Patrick French Male    DOB: 08-Aug-1940   74 y.o.   MRN: [id number] Visit Date: 12/13/2014  Today's Provider: Lelon Huh, MD   Chief Complaint  Patient presents with  . Cough   Subjective:    Cough This is a new problem. The current episode started in the past 7 days (12/08/2014). The problem has been gradually worsening. The cough is non-productive. Associated symptoms include nasal congestion and postnasal drip. Pertinent negatives include no chest pain, chills, ear congestion, ear pain, fever, headaches, sore throat, shortness of breath or wheezing. Nothing aggravates the symptoms. Treatments tried: nyquil. The treatment provided mild relief.      No Known Allergies Previous Medications   ASPIRIN 81 MG TABLET    Take by mouth.   DOXYCYCLINE (VIBRA-TABS) 100 MG TABLET    Take 1 tablet (100 mg total) by mouth 2 (two) times daily.   LISINOPRIL (PRINIVIL,ZESTRIL) 10 MG TABLET    Take 1 tablet (10 mg total) by mouth daily.   MULTIPLE VITAMIN PO    Take by mouth.   NEEDLE, DISP, 22 G 22G X 1" MISC       NIACIN 500 MG CR CAPSULE    Take by mouth.   OMEGA-3 FATTY ACIDS (FISH OIL BURP-LESS) 1000 MG CAPS    Take by mouth.   SILDENAFIL (VIAGRA) 100 MG TABLET    Take by mouth.   TESTOSTERONE CYPIONATE (DEPOTESTOSTERONE CYPIONATE) 200 MG/ML INJECTION    Inject 0.5 mLs (100 mg total) into the muscle every 14 (fourteen) days.    Review of Systems  Constitutional: Negative for fever, chills and appetite change.  HENT: Positive for congestion and postnasal drip. Negative for ear discharge, ear pain, sinus pressure and sore throat.   Respiratory: Positive for cough. Negative for chest tightness, shortness of breath and wheezing.   Cardiovascular: Negative for chest pain and palpitations.  Gastrointestinal: Negative for nausea, vomiting and abdominal pain.  Neurological: Negative for headaches.    Social History  Substance Use Topics  . Smoking status: Former Smoker --  2.00 packs/day for 13 years    Quit date: 01/10/1970  . Smokeless tobacco: Never Used  . Alcohol Use: Yes     Comment: occasional   Objective:   BP 118/80 mmHg  Pulse 79  Temp(Src) 98 F (36.7 C) (Oral)  Resp 16  Ht 6\' 2"  (1.88 m)  Wt 194 lb (87.998 kg)  BMI 24.90 kg/m2  SpO2 93%  Physical Exam  General Appearance:    Alert, cooperative, no distress  HENT:   bilateral TM normal without fluid or infection, neck without nodes, sinuses nontender and nasal mucosa pale and congested  Eyes:    PERRL, conjunctiva/corneas clear, EOM's intact       Lungs:     Clear to auscultation bilaterally, respirations unlabored  Heart:    Regular rate and rhythm  Neurologic:   Awake, alert, oriented x 3. No apparent focal neurological           defect.           Assessment & Plan:     1. Upper respiratory infection Counseled regarding signs and symptoms of viral and bacterial respiratory infections. Advised fill prescription for amoxicillin if he develops any sign of bacterial infection, or if current symptoms last longer than 10 days.    He plans on returning after dental procedure next week to get flu vaccine.       Lelon Huh,  MD  [place]

## 2014-12-13 NOTE — Patient Instructions (Signed)
Start taking OTC Allegra (fexofenadine) and Mucinex (guaifenesin)  Do take any OTC decongestants Start taking antibiotic if you start having sinus pain or fever or if you are not improving within 2 days

## 2014-12-25 ENCOUNTER — Other Ambulatory Visit: Payer: Self-pay | Admitting: *Deleted

## 2014-12-27 ENCOUNTER — Encounter: Payer: Self-pay | Admitting: Family Medicine

## 2014-12-27 ENCOUNTER — Ambulatory Visit (INDEPENDENT_AMBULATORY_CARE_PROVIDER_SITE_OTHER): Payer: Medicare Other | Admitting: Family Medicine

## 2014-12-27 VITALS — BP 102/70 | HR 84 | Temp 97.8°F | Resp 16 | Ht 74.0 in | Wt 194.0 lb

## 2014-12-27 DIAGNOSIS — R05 Cough: Secondary | ICD-10-CM

## 2014-12-27 DIAGNOSIS — R059 Cough, unspecified: Secondary | ICD-10-CM

## 2014-12-27 DIAGNOSIS — J069 Acute upper respiratory infection, unspecified: Secondary | ICD-10-CM | POA: Diagnosis not present

## 2014-12-27 MED ORDER — HYDROCODONE-HOMATROPINE 5-1.5 MG/5ML PO SYRP: 5.0000 mL | ORAL_SOLUTION | Freq: Three times a day (TID) | ORAL | Status: DC | PRN

## 2014-12-27 MED ORDER — MONTELUKAST SODIUM 10 MG PO TABS: 10.0000 mg | ORAL_TABLET | Freq: Every day | ORAL | Status: DC

## 2014-12-27 NOTE — Progress Notes (Signed)
Patient: Patrick French Male    DOB: Jun 22, 1940   74 y.o.   MRN: [id number] Visit Date: 12/27/2014  Today's Provider: Lelon Huh, MD   Chief Complaint  Patient presents with  . Sinusitis  . Cough   Subjective:    Cough This is a recurrent problem. The current episode started 1 to 4 weeks ago (12/2014). The problem has been unchanged. The problem occurs constantly. The cough is non-productive. Associated symptoms include postnasal drip and a sore throat. Pertinent negatives include no chest pain, chills, ear pain, fever, shortness of breath or wheezing. The symptoms are aggravated by lying down. He has tried prescription cough suppressant, rest and OTC cough suppressant (amoxicillin, delsym) for the symptoms. The treatment provided mild relief.    Patient was seen in office for URI 12/13/2014; Counseled regarding signs and symptoms of viral and bacterial respiratory infections.  He was given option of started amoxicillin which he started about 5 days ago. Cough is still persistent and non-productive, but has a lot of drainage down the back of his throat which is triggering cough. Is worse when he lays down and is keeping awake at night. Tried OTC Allegra, mucinex and Delsym which didn't help.     No Known Allergies Previous Medications   AMOXICILLIN (AMOXIL) 500 MG CAPSULE    Take 1 capsule (500 mg total) by mouth 3 (three) times daily.   ASPIRIN 81 MG TABLET    Take by mouth.   LISINOPRIL (PRINIVIL,ZESTRIL) 10 MG TABLET    Take 1 tablet (10 mg total) by mouth daily.   MULTIPLE VITAMIN PO    Take by mouth.   NEEDLE, DISP, 22 G 22G X 1" MISC       NIACIN 500 MG CR CAPSULE    Take by mouth.   OMEGA-3 FATTY ACIDS (FISH OIL BURP-LESS) 1000 MG CAPS    Take by mouth.   SILDENAFIL (VIAGRA) 100 MG TABLET    Take by mouth.   SYRINGE-NEEDLE, DISP, 3 ML (B-D SYRINGE/NEEDLE 3CC/22GX1.5) 22G X 1-1/2" 3 ML MISC    by Does not apply route.   TESTOSTERONE CYPIONATE (DEPOTESTOSTERONE  CYPIONATE) 200 MG/ML INJECTION    Inject 0.5 mLs (100 mg total) into the muscle every 14 (fourteen) days.    Review of Systems  Constitutional: Negative for fever, chills and appetite change.  HENT: Positive for congestion, postnasal drip, sneezing and sore throat. Negative for ear discharge and ear pain.   Respiratory: Positive for cough. Negative for chest tightness, shortness of breath and wheezing.   Cardiovascular: Negative for chest pain and palpitations.  Gastrointestinal: Negative for nausea, vomiting and abdominal pain.    Social History  Substance Use Topics  . Smoking status: Former Smoker -- 2.00 packs/day for 13 years    Quit date: 01/10/1970  . Smokeless tobacco: Never Used  . Alcohol Use: Yes     Comment: occasional   Objective:   BP 102/70 mmHg  Pulse 84  Temp(Src) 97.8 F (36.6 C) (Oral)  Resp 16  Ht 6\' 2"  (1.88 m)  Wt 194 lb (87.998 kg)  BMI 24.90 kg/m2  SpO2 95%  Physical Exam  General Appearance:    Alert, cooperative, no distress  HENT:   bilateral TM normal without fluid or infection, neck without nodes, sinuses nontender, post nasal drip noted and nasal mucosa pale and congested  Eyes:    PERRL, conjunctiva/corneas clear, EOM's intact       Lungs:  Clear to auscultation bilaterally, respirations unlabored  Heart:    Regular rate and rhythm  Neurologic:   Awake, alert, oriented x 3. No apparent focal neurological           defect.           Assessment & Plan:      1. Upper respiratory infection   2. Cough  - HYDROcodone-homatropine (HYCODAN) 5-1.5 MG/5ML syrup; Take 5 mLs by mouth every 8 (eight) hours as needed for cough.  Dispense: 120 mL; Refill: 0 - montelukast (SINGULAIR) 10 MG tablet; Take 1 tablet (10 mg total) by mouth at bedtime.  Dispense: 30 tablet; Refill: 0  Call if symptoms change or if not rapidly improving.          Lelon Huh, MD  [facility]

## 2015-01-03 ENCOUNTER — Inpatient Hospital Stay: Payer: Medicare Other

## 2015-01-03 ENCOUNTER — Inpatient Hospital Stay: Payer: Medicare Other | Attending: Internal Medicine

## 2015-01-03 VITALS — BP 112/79 | HR 96 | Temp 97.0°F | Resp 18

## 2015-01-03 DIAGNOSIS — D751 Secondary polycythemia: Secondary | ICD-10-CM | POA: Diagnosis not present

## 2015-01-03 LAB — HEMATOCRIT: HEMATOCRIT: 50.1 % (ref 40.0–52.0)

## 2015-02-04 ENCOUNTER — Other Ambulatory Visit: Payer: Self-pay | Admitting: *Deleted

## 2015-02-04 DIAGNOSIS — D751 Secondary polycythemia: Secondary | ICD-10-CM

## 2015-02-07 ENCOUNTER — Inpatient Hospital Stay: Payer: Medicare Other | Attending: Internal Medicine

## 2015-02-07 ENCOUNTER — Inpatient Hospital Stay: Payer: Medicare Other

## 2015-02-07 ENCOUNTER — Encounter: Payer: Self-pay | Admitting: *Deleted

## 2015-02-07 ENCOUNTER — Other Ambulatory Visit: Payer: Medicare Other

## 2015-02-07 ENCOUNTER — Inpatient Hospital Stay (HOSPITAL_BASED_OUTPATIENT_CLINIC_OR_DEPARTMENT_OTHER): Payer: Medicare Other | Admitting: Internal Medicine

## 2015-02-07 VITALS — BP 159/94 | HR 71 | Temp 97.6°F | Resp 18 | Wt 194.6 lb

## 2015-02-07 DIAGNOSIS — Q796 Ehlers-Danlos syndrome: Secondary | ICD-10-CM | POA: Diagnosis not present

## 2015-02-07 DIAGNOSIS — D751 Secondary polycythemia: Secondary | ICD-10-CM

## 2015-02-07 DIAGNOSIS — Z86010 Personal history of colonic polyps: Secondary | ICD-10-CM | POA: Diagnosis not present

## 2015-02-07 DIAGNOSIS — Z79899 Other long term (current) drug therapy: Secondary | ICD-10-CM | POA: Insufficient documentation

## 2015-02-07 DIAGNOSIS — Z87891 Personal history of nicotine dependence: Secondary | ICD-10-CM | POA: Insufficient documentation

## 2015-02-07 DIAGNOSIS — Z7982 Long term (current) use of aspirin: Secondary | ICD-10-CM | POA: Insufficient documentation

## 2015-02-07 LAB — CBC WITH DIFFERENTIAL/PLATELET
Basophils Absolute: 0.1 10*3/uL (ref 0–0.1)
Basophils Relative: 1 %
EOS PCT: 9 %
Eosinophils Absolute: 0.5 10*3/uL (ref 0–0.7)
HEMATOCRIT: 48.3 % (ref 40.0–52.0)
Hemoglobin: 16.8 g/dL (ref 13.0–18.0)
LYMPHS ABS: 1.3 10*3/uL (ref 1.0–3.6)
LYMPHS PCT: 21 %
MCH: 32.3 pg (ref 26.0–34.0)
MCHC: 34.8 g/dL (ref 32.0–36.0)
MCV: 92.6 fL (ref 80.0–100.0)
MONO ABS: 0.7 10*3/uL (ref 0.2–1.0)
MONOS PCT: 10 %
Neutro Abs: 3.7 10*3/uL (ref 1.4–6.5)
Neutrophils Relative %: 59 %
PLATELETS: 167 10*3/uL (ref 150–440)
RBC: 5.21 MIL/uL (ref 4.40–5.90)
RDW: 13.6 % (ref 11.5–14.5)
WBC: 6.3 10*3/uL (ref 3.8–10.6)

## 2015-02-07 NOTE — Progress Notes (Signed)
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   INTERVAL HISTORY:  This is my first interaction with the patient since I joined the practice September 2016. I reviewed the patient's prior charts/pertinent labs findings are summarized above.   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 Patient has been getting phlebotomies every 6 weeks to keep the hematocrit less than 46.   Patient denies having any headaches or vision changes. Appetite is good. Denies any weight loss or nausea vomiting. Denies any night sweats or fevers.  REVIEW OF SYSTEMS:  A complete 10 point review of system is done which is negative except mentioned above/history of present illness.   PAST MEDICAL HISTORY :  Past Medical History  Diagnosis Date  . 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 Testosterone deficiency   . Ehlers-Danlos disease   . History of colon polyps     PAST SURGICAL HISTORY :   Past Surgical History  Procedure Laterality Date  . Skin lesion excision  05/2012  . Cholecystectomy  1988  . Appendectomy  1992    FAMILY HISTORY :   Family History  Problem Relation Age of Onset  . Lung cancer Mother     died at age 19  . Ovarian cancer Sister     deceased from ovarian cancer  . Heart disease Sister     died at age 28 due to complications from heart surgery  . Pneumonia Father 92    died at age 66 from complication of pneumonia    SOCIAL HISTORY:   Social History  Substance Use Topics  . Smoking status: Former Smoker -- 2.00 packs/day for 13 years    Types: Cigarettes    Quit date: 01/10/1970  . Smokeless tobacco: Never Used  . Alcohol Use: 0.0 oz/week    0  Standard drinks or equivalent per week     Comment: occasional    ALLERGIES:  has No Known Allergies.  MEDICATIONS:  Current Outpatient Prescriptions  Medication Sig Dispense Refill  . aspirin 81 MG tablet Take 81 mg by mouth daily.     Marland Kitchen lisinopril (PRINIVIL,ZESTRIL) 10 MG tablet Take 1 tablet (10 mg total) by mouth daily. 30 tablet 5  . MULTIPLE VITAMIN PO Take 1 tablet by mouth daily.     . niacin 500 MG CR capsule Take 500 mg by mouth at bedtime.     . Omega-3 Fatty Acids (FISH OIL BURP-LESS) 1000 MG CAPS Take 1 capsule by mouth 2 (two) times daily.     . sildenafil (VIAGRA) 100 MG tablet Take 100 mg by mouth as needed.     . testosterone cypionate (DEPOTESTOSTERONE CYPIONATE) 200 MG/ML injection Inject 0.5 mLs (100 mg total) into the muscle every 14 (fourteen) days. 10 mL 1   No current facility-administered medications for this visit.    PHYSICAL EXAMINATION:   BP 159/94 mmHg  Pulse 71  Temp(Src) 97.6 F (36.4 C) (Tympanic)  Resp 18  Wt 194 lb 8.9 oz (88.25 kg)  Filed Weights   02/07/15 1343  Weight: 194 lb 8.9 oz (88.25 kg)    GENERAL: Well-nourished well-developed; Alert, no distress and comfortable.  Alone.  EYES: no pallor or  icterus OROPHARYNX: no thrush or ulceration. NECK: supple, no masses felt LYMPH:  no palpable lymphadenopathy in the cervical, axillary or inguinal regions LUNGS: clear to auscultation and  No wheeze or crackles HEART/CVS: regular rate & rhythm and no murmurs; No lower extremity edema ABDOMEN:abdomen soft, non-tender and normal bowel sounds Musculoskeletal:no cyanosis of digits and no clubbing  PSYCH: alert & oriented x 3 with fluent speech NEURO: no focal motor/sensory deficits SKIN:  no rashes or significant lesions  LABORATORY DATA:  I have reviewed the data as listed    Component Value Date/Time   NA 139 07/09/2014 1048   K 4.6 07/09/2014 1048   CL 98 07/09/2014 1048   CO2 25 07/09/2014 1048   GLUCOSE 107* 07/09/2014 1048    BUN 17 07/09/2014 1048   CREATININE 1.04 07/09/2014 1048   CREATININE 1.10 10/16/2012 1346   CALCIUM 9.5 07/09/2014 1048   PROT 6.5 07/09/2014 1048   PROT 7.1 10/16/2012 1346   ALBUMIN 4.0 07/09/2014 1048   ALBUMIN 3.8 10/16/2012 1346   AST 39 07/09/2014 1048   AST 19 10/16/2012 1346   ALT 61* 07/09/2014 1048   ALT 27 10/16/2012 1346   ALKPHOS 131* 07/09/2014 1048   ALKPHOS 92 10/16/2012 1346   BILITOT 0.8 07/09/2014 1048   BILITOT 0.6 10/16/2012 1346   GFRNONAA 70 07/09/2014 1048   GFRNONAA >60 10/16/2012 1346   GFRAA 81 07/09/2014 1048   GFRAA >60 10/16/2012 1346    No results found for: SPEP, UPEP  Lab Results  Component Value Date   WBC 6.3 02/07/2015   NEUTROABS 3.7 02/07/2015   HGB 16.8 02/07/2015   HCT 48.3 02/07/2015   MCV 92.6 02/07/2015   PLT 167 02/07/2015      Chemistry      Component Value Date/Time   NA 139 07/09/2014 1048   K 4.6 07/09/2014 1048   CL 98 07/09/2014 1048   CO2 25 07/09/2014 1048   BUN 17 07/09/2014 1048   CREATININE 1.04 07/09/2014 1048   CREATININE 1.10 10/16/2012 1346      Component Value Date/Time   CALCIUM 9.5 07/09/2014 1048   ALKPHOS 131* 07/09/2014 1048   ALKPHOS 92 10/16/2012 1346   AST 39 07/09/2014 1048   AST 19 10/16/2012 1346   ALT 61* 07/09/2014 1048   ALT 27 10/16/2012 1346   BILITOT 0.8 07/09/2014 1048   BILITOT 0.6 10/16/2012 1346       RADIOGRAPHIC STUDIES: I have personally reviewed the radiological images as listed and agreed with the findings in the report. No results found.   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 Patient has been getting phlebotomy to keep the hematocrit less than 46 every 6 weeks or so. Patient has not noted any significant improvement clinically pre and post phlebotomy.   # I discussed with the patient that we could stretch the phlebotomies to every 8 weeks; to keep the hematocrit less than 50. I discussed differences between the  primary and secondary polycythemia as in detail. Patient agrees with the plan. Py continues to be on asprin 81mg /d  # Phlebotomy 350 ml every 2 months/with hematocrit; follow-up with me in 6 months.  # 15 minutes face-to-face with the patient discussing the above plan of care; more than 50% of time spent on prognosis/ natural history; counseling and coordination.      Cammie Sickle, MD 02/07/2015 1:56 PM

## 2015-02-14 ENCOUNTER — Other Ambulatory Visit: Payer: Self-pay

## 2015-02-14 ENCOUNTER — Ambulatory Visit: Payer: Self-pay | Admitting: Internal Medicine

## 2015-03-21 ENCOUNTER — Inpatient Hospital Stay: Payer: Medicare Other | Attending: Internal Medicine

## 2015-03-21 ENCOUNTER — Inpatient Hospital Stay: Payer: Medicare Other

## 2015-03-21 VITALS — BP 115/77 | HR 90 | Resp 20

## 2015-03-21 DIAGNOSIS — D751 Secondary polycythemia: Secondary | ICD-10-CM | POA: Diagnosis not present

## 2015-03-21 DIAGNOSIS — Z79899 Other long term (current) drug therapy: Secondary | ICD-10-CM | POA: Diagnosis not present

## 2015-03-21 LAB — HEMATOCRIT: HCT: 49.4 % (ref 40.0–52.0)

## 2015-04-30 DIAGNOSIS — D229 Melanocytic nevi, unspecified: Secondary | ICD-10-CM | POA: Diagnosis not present

## 2015-04-30 DIAGNOSIS — Z1283 Encounter for screening for malignant neoplasm of skin: Secondary | ICD-10-CM | POA: Diagnosis not present

## 2015-04-30 DIAGNOSIS — D2272 Melanocytic nevi of left lower limb, including hip: Secondary | ICD-10-CM | POA: Diagnosis not present

## 2015-04-30 DIAGNOSIS — Z85828 Personal history of other malignant neoplasm of skin: Secondary | ICD-10-CM | POA: Diagnosis not present

## 2015-04-30 DIAGNOSIS — L812 Freckles: Secondary | ICD-10-CM | POA: Diagnosis not present

## 2015-04-30 DIAGNOSIS — I8393 Asymptomatic varicose veins of bilateral lower extremities: Secondary | ICD-10-CM | POA: Diagnosis not present

## 2015-04-30 DIAGNOSIS — D485 Neoplasm of uncertain behavior of skin: Secondary | ICD-10-CM | POA: Diagnosis not present

## 2015-04-30 DIAGNOSIS — D1800 Hemangioma unspecified site: Secondary | ICD-10-CM | POA: Diagnosis not present

## 2015-04-30 DIAGNOSIS — L578 Other skin changes due to chronic exposure to nonionizing radiation: Secondary | ICD-10-CM | POA: Diagnosis not present

## 2015-05-12 ENCOUNTER — Ambulatory Visit (INDEPENDENT_AMBULATORY_CARE_PROVIDER_SITE_OTHER): Payer: Medicare Other | Admitting: Family Medicine

## 2015-05-12 ENCOUNTER — Encounter: Payer: Self-pay | Admitting: Family Medicine

## 2015-05-12 VITALS — BP 124/84 | HR 74 | Temp 97.8°F | Resp 16 | Ht 74.5 in | Wt 196.0 lb

## 2015-05-12 DIAGNOSIS — E7800 Pure hypercholesterolemia, unspecified: Secondary | ICD-10-CM

## 2015-05-12 DIAGNOSIS — N529 Male erectile dysfunction, unspecified: Secondary | ICD-10-CM | POA: Insufficient documentation

## 2015-05-12 DIAGNOSIS — Z23 Encounter for immunization: Secondary | ICD-10-CM

## 2015-05-12 DIAGNOSIS — E291 Testicular hypofunction: Secondary | ICD-10-CM | POA: Diagnosis not present

## 2015-05-12 DIAGNOSIS — D751 Secondary polycythemia: Secondary | ICD-10-CM | POA: Diagnosis not present

## 2015-05-12 DIAGNOSIS — Z125 Encounter for screening for malignant neoplasm of prostate: Secondary | ICD-10-CM | POA: Diagnosis not present

## 2015-05-12 DIAGNOSIS — Z0000 Encounter for general adult medical examination without abnormal findings: Secondary | ICD-10-CM

## 2015-05-12 DIAGNOSIS — I10 Essential (primary) hypertension: Secondary | ICD-10-CM

## 2015-05-12 DIAGNOSIS — Z129 Encounter for screening for malignant neoplasm, site unspecified: Secondary | ICD-10-CM | POA: Diagnosis not present

## 2015-05-12 MED ORDER — LISINOPRIL 10 MG PO TABS: 10.0000 mg | ORAL_TABLET | Freq: Every day | ORAL | Status: DC

## 2015-05-12 MED ORDER — TESTOSTERONE CYPIONATE 200 MG/ML IM SOLN: 100.0000 mg | INTRAMUSCULAR | Status: DC

## 2015-05-12 MED ORDER — SILDENAFIL CITRATE 20 MG PO TABS: ORAL_TABLET | ORAL | Status: DC

## 2015-05-12 NOTE — Progress Notes (Signed)
Patient: Patrick French, Male    DOB: [birth year], 75 y.o.   MRN: [id number] Visit Date: 05/12/2015  Today's Provider: Lelon Huh, MD   Chief Complaint  Patient presents with  . Medicare Wellness  . Hypertension    follow up  . Hyperlipidemia    follow up   Subjective:    Annual wellness visit [name] is a 75 y.o. male. He feels well. He reports exercising daily. He reports he is sleeping well.  -----------------------------------------------------------  Hypertension, follow-up:  BP Readings from Last 3 Encounters:  03/21/15 115/77  02/07/15 159/94  01/03/15 112/79    He was last seen for hypertension 1 years ago.  BP at that visit was 136/100. Management since that visit includes restarting Lisinopril again (patient had been out of medication). He reports good compliance with treatment. He is not having side effects.  He is exercising. He is adherent to low salt diet.   Outside blood pressures are not being checked. He is experiencing none.  Patient denies chest pain, chest pressure/discomfort, claudication, dyspnea, exertional chest pressure/discomfort, fatigue, irregular heart beat, lower extremity edema, near-syncope, orthopnea, palpitations, paroxysmal nocturnal dyspnea, syncope and tachypnea.   Cardiovascular risk factors include advanced age (older than 63 for men, 66 for women) and hypertension.  Use of agents associated with hypertension: NSAIDS.     Weight trend: stable Wt Readings from Last 3 Encounters:  02/07/15 194 lb 8.9 oz (88.25 kg)  12/27/14 194 lb (87.998 kg)  12/13/14 194 lb (87.998 kg)    Current diet: in general, a "healthy" diet    ------------------------------------------------------------------------   Lipid/Cholesterol, Follow-up:   Last seen for this1 years ago.  Management changes since that visit include no changes. . Last Lipid Panel: No results found for: CHOL, TRIG, HDL, CHOLHDL, VLDL, LDLCALC,  LDLDIRECT  Risk factors for vascular disease include hypercholesterolemia and hypertension  He reports good compliance with treatment. He is not having side effects.  Current symptoms include none and have been stable. Weight trend: stable Prior visit with dietician: no Current diet: in general, a "healthy" diet   Current exercise: bicycling  Wt Readings from Last 3 Encounters:  02/07/15 194 lb 8.9 oz (88.25 kg)  12/27/14 194 lb (87.998 kg)  12/13/14 194 lb (87.998 kg)    -------------------------------------------------------------------  Follow up Hypogonadism: Last office visit was 1 year ago and no changes were made. Patient reports good compliance with treatment. He states he feels well, energy level is good, mood is good. He is having ED and requests prescription for generic sildenafil. Has tolerated Viagra well in the past.    Review of Systems  Constitutional: Negative for fever, chills, appetite change and fatigue.  HENT: Negative for congestion, ear pain, hearing loss, nosebleeds and trouble swallowing.   Eyes: Negative for pain and visual disturbance.  Respiratory: Negative for cough, chest tightness and shortness of breath.   Cardiovascular: Negative for chest pain, palpitations and leg swelling.  Gastrointestinal: Negative for nausea, vomiting, abdominal pain, diarrhea, constipation and blood in stool.  Endocrine: Negative for polydipsia, polyphagia and polyuria.  Genitourinary: Positive for frequency and decreased urine volume. Negative for dysuria and flank pain.  Musculoskeletal: Positive for neck pain. Negative for myalgias, back pain, joint swelling, arthralgias and neck stiffness.  Skin: Negative for color change, rash and wound.  Neurological: Negative for dizziness, tremors, seizures, speech difficulty, weakness, light-headedness and headaches.  Hematological: Bruises/bleeds easily.  Psychiatric/Behavioral: Negative for behavioral problems, confusion, sleep  disturbance, dysphoric mood  and decreased concentration. The patient is not nervous/anxious.   All other systems reviewed and are negative.   Social History   Social History  . Marital Status: Married    Spouse Name: N/A  . Number of Children: N/A  . Years of Education: N/A   Occupational History  . Not on file.   Social History Main Topics  . Smoking status: Former Smoker -- 2.00 packs/day for 13 years    Types: Cigarettes    Quit date: 01/10/1970  . Smokeless tobacco: Never Used  . Alcohol Use: 0.0 oz/week    0 Standard drinks or equivalent per week     Comment: occasional  . Drug Use: No  . Sexual Activity: Not on file   Other Topics Concern  . Not on file   Social History Narrative    Past Medical History  Diagnosis Date  . 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 Testosterone deficiency   . Ehlers-Danlos disease   . History of colon polyps      Patient Active Problem List   Diagnosis Date Noted  . 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 Fatigue 07/09/2014  . Allergic rhinitis 07/05/2014  . 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 History of colon polyps 07/05/2014  . 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 Polycythemia 07/05/2014  . Hypercholesterolemia without hypertriglyceridemia 07/05/2014  . Hypertension 06/23/2014    Past Surgical History  Procedure Laterality Date  . Skin lesion excision  05/2012  . Cholecystectomy  1988  . Appendectomy  1992    His family history includes Heart disease in his sister; Lung cancer in his mother; Ovarian cancer in his sister; Pneumonia (age of onset: 80) in his father.    Previous Medications   ASPIRIN 81 MG TABLET    Take 81 mg by mouth daily.    LISINOPRIL (PRINIVIL,ZESTRIL) 10 MG TABLET    Take 1 tablet (10 mg total) by mouth daily.   MULTIPLE VITAMIN PO    Take 1 tablet by mouth daily.    NIACIN 500 MG CR CAPSULE    Take 500 mg by mouth at bedtime.    OMEGA-3 FATTY ACIDS (FISH OIL BURP-LESS) 1000 MG CAPS    Take  1 capsule by mouth 2 (two) times daily.    SILDENAFIL (VIAGRA) 100 MG TABLET    Take 100 mg by mouth as needed.    TESTOSTERONE CYPIONATE (DEPOTESTOSTERONE CYPIONATE) 200 MG/ML INJECTION    Inject 0.5 mLs (100 mg total) into the muscle every 14 (fourteen) days.    Patient Care Team: Birdie Sons, MD as PCP - General (Family Medicine) Cammie Sickle, MD as Consulting Physician (Hematology)     Objective:   Vitals: BP 124/84 mmHg  Pulse 74  Temp(Src) 97.8 F (36.6 C) (Oral)  Resp 16  Ht 6' 2.5" (1.892 m)  Wt 196 lb (88.905 kg)  BMI 24.84 kg/m2  SpO2 96%  Physical Exam   General Appearance:    Alert, cooperative, no distress, appears stated age  Head:    Normocephalic, without obvious abnormality, atraumatic  Eyes:    PERRL, conjunctiva/corneas clear, EOM's intact, fundi    benign, both eyes       Ears:    Normal TM's and external ear canals, both ears  Nose:   Nares normal, septum midline, mucosa normal, no drainage   or sinus tenderness  Throat:   Lips, mucosa, and tongue normal; teeth and gums normal  Neck:   Supple, symmetrical, trachea midline, no  adenopathy;       thyroid:  No enlargement/tenderness/nodules; no carotid   bruit or JVD  Back:     Symmetric, no curvature, ROM normal, no CVA tenderness  Lungs:     Clear to auscultation bilaterally, respirations unlabored  Chest wall:    No tenderness or deformity  Heart:    Regular rate and rhythm, S1 and S2 normal, no murmur, rub   or gallop  Abdomen:     Soft, non-tender, bowel sounds active all four quadrants,    no masses, no organomegaly  Genitalia:    deferred  Rectal:    deferred  Extremities:   Extremities normal, atraumatic, no cyanosis or edema  Pulses:   2+ and symmetric all extremities  Skin:   Skin color, texture, turgor normal, no rashes or lesions  Lymph nodes:   Cervical, supraclavicular, and axillary nodes normal  Neurologic:   CNII-XII intact. Normal strength, sensation and reflexes       throughout    Activities of Daily Living In your present state of health, do you have any difficulty performing the following activities: 05/12/2015  Hearing? Y  Vision? N  Difficulty concentrating or making decisions? N  Walking or climbing stairs? N  Dressing or bathing? N  Doing errands, shopping? N    Fall Risk Assessment Fall Risk  05/12/2015  Falls in the past year? No     Depression Screen PHQ 2/9 Scores 05/12/2015  PHQ - 2 Score 0    Cognitive Testing - 6-CIT  Correct? Score   What year is it? yes 0 0 or 4  What month is it? yes 0 0 or 3  Memorize:    Pia Mau,  42,  [address]  [place],      What time is it? (within 1 hour) yes 0 0 or 3  Count backwards from 20 yes 0 0, 2, or 4  Name the months of the year yes 0 0, 2, or 4  Repeat name & address above no 3 0, 2, 4, 6, 8, or 10       TOTAL SCORE  3/28   Interpretation:  Normal  Normal (0-7) Abnormal (8-28)    Current Exercise Habits: Home exercise routine, Type of exercise: Other - see comments (cycling on mountain bike), Time (Minutes): 30, Frequency (Times/Week): 7, Weekly Exercise (Minutes/Week): 210, Intensity: Moderate Exercise limited by: None identified   Audit-C Alcohol Use Screening  Question Answer Points  How often do you have alcoholic drink? 2-3 times weekly 3  On days you do drink alcohol, how many drinks do you typically consume? 0 0  How oftey will you drink 6 or more in a total? never 0  Total Score:  3   A score of 3 or more in women, and 4 or more in men indicates increased risk for alcohol abuse, EXCEPT if all of the points are from question 1.    Assessment & Plan:     Annual Wellness Visit  Reviewed patient's Family Medical History Reviewed and updated list of patient's medical providers Assessment of cognitive impairment was done Assessed patient's functional ability Established a written schedule for health screening [place] Completed and  Reviewed  Exercise Activities and Dietary recommendations Goals    None      Immunization History  Administered Date(s) Administered  . Pneumococcal Conjugate-13 02/02/2013    Health Maintenance  Topic Date Due  . TETANUS/TDAP  03/16/1959  . COLONOSCOPY  03/16/1990  .  ZOSTAVAX  03/15/2000  . PNA vac Low Risk Adult (2 of 2 - PPSV23) 02/02/2014  . INFLUENZA VACCINE  08/12/2015      Discussed health benefits of physical activity, and encouraged him to engage in regular exercise appropriate for his age and condition.    ------------------------------------------------------------------------------------------------------------ 1. Medicare annual wellness visit, subsequent Generally doing well  2. Hypercholesterolemia without hypertriglyceridemia On Niacin   3. Essential hypertension Well controlled.  Continue current medications.   - EKG 12-Lead - Lipid panel - Renal function panel  4. Polycythemia Continue follow up hematology for prn phlebotomies.   5. Need for pneumococcal vaccination  - Pneumococcal polysaccharide vaccine 23-valent greater than or equal to 2yo subcutaneous/IM  6. Prostate cancer screening  - PSA  7. Hypogonadism in male Currently doing well. Check levels. - Testosterone,Free and Total  8. Erectile dysfunction, unspecified erectile dysfunction type Rx generic sildenafil 20mg

## 2015-05-13 LAB — RENAL FUNCTION PANEL
Albumin: 4.3 g/dL (ref 3.5–4.8)
BUN / CREAT RATIO: 10 (ref 10–24)
BUN: 12 mg/dL (ref 8–27)
CO2: 24 mmol/L (ref 18–29)
CREATININE: 1.17 mg/dL (ref 0.76–1.27)
Calcium: 9.3 mg/dL (ref 8.6–10.2)
Chloride: 100 mmol/L (ref 96–106)
GFR, EST AFRICAN AMERICAN: 70 mL/min/{1.73_m2} (ref >59)
GFR, EST NON AFRICAN AMERICAN: 61 mL/min/{1.73_m2} (ref >59)
Glucose: 100 mg/dL — ABNORMAL HIGH (ref 65–99)
Phosphorus: 3 mg/dL (ref 2.5–4.5)
Potassium: 4.8 mmol/L (ref 3.5–5.2)
SODIUM: 139 mmol/L (ref 134–144)

## 2015-05-13 LAB — LIPID PANEL
CHOL/HDL RATIO: 5.6 ratio — AB (ref 0.0–5.0)
Cholesterol, Total: 192 mg/dL (ref 100–199)
HDL: 34 mg/dL — AB (ref >39)
LDL CALC: 125 mg/dL — AB (ref 0–99)
TRIGLYCERIDES: 166 mg/dL — AB (ref 0–149)
VLDL CHOLESTEROL CAL: 33 mg/dL (ref 5–40)

## 2015-05-13 LAB — TESTOSTERONE,FREE AND TOTAL
Testosterone, Free: 6.4 pg/mL — ABNORMAL LOW (ref 6.6–18.1)
Testosterone: 277 ng/dL — ABNORMAL LOW (ref 348–1197)

## 2015-05-13 LAB — PSA: Prostate Specific Ag, Serum: 1.1 ng/mL (ref 0.0–4.0)

## 2015-05-14 ENCOUNTER — Telehealth: Payer: Self-pay | Admitting: Family Medicine

## 2015-05-14 NOTE — Telephone Encounter (Signed)
Pt returned call from [place].  Please call him back at [phone]  Thanks Con Memos

## 2015-05-15 NOTE — Telephone Encounter (Signed)
-----   Message from Birdie Sons, MD sent at 05/14/2015  7:58 AM EDT ----- Testosterone level is borderline low. Recommend he stay on same dose of testosterone  Injections. Cholesterol is 192, but HDL (good cholesterol) is too low. Current guidelines are to take a statin to reduce risk of heart disease if HDL is low. Recommend he start pravastatin 20mg  daily, #30, rf x 3 and follow up in 3 months. If he takes pravastatin he can stop taking Niacin.

## 2015-05-15 NOTE — Telephone Encounter (Signed)
Pt called back again.  teri

## 2015-05-15 NOTE — Telephone Encounter (Signed)
Patient's wife stated that patient was on a statin years ago and stopped because it was effecting his liver. Remo Lipps wants to know if the pravastatin is safe for him to take now?

## 2015-05-15 NOTE — Telephone Encounter (Signed)
Patient was notified.

## 2015-05-15 NOTE — Telephone Encounter (Signed)
Patient's wife Remo Lipps was notified of results. Expressed understanding.

## 2015-05-15 NOTE — Telephone Encounter (Signed)
Hepatic panel added. Waiting for approval form to be fax.

## 2015-05-15 NOTE — Telephone Encounter (Signed)
In most cases, taking a different statin will not affect liver.  Please have [facility] add hepatic panel to labs drawn last week. If normal would recommend starting pravastatin and checking lipids, and hepatic panel in 6 weeks.

## 2015-05-16 LAB — HEPATIC FUNCTION PANEL
ALBUMIN: 4.4 g/dL (ref 3.5–4.8)
ALT: 16 IU/L (ref 0–44)
AST: 19 IU/L (ref 0–40)
Alkaline Phosphatase: 91 IU/L (ref 39–117)
BILIRUBIN TOTAL: 0.5 mg/dL (ref 0.0–1.2)
BILIRUBIN, DIRECT: 0.15 mg/dL (ref 0.00–0.40)
TOTAL PROTEIN: 6.9 g/dL (ref 6.0–8.5)

## 2015-05-16 LAB — SPECIMEN STATUS REPORT

## 2015-05-16 MED ORDER — PRAVASTATIN SODIUM 20 MG PO TABS: 20.0000 mg | ORAL_TABLET | Freq: Every day | ORAL | Status: DC

## 2015-05-16 NOTE — Telephone Encounter (Signed)
-----   Message from Birdie Sons, MD sent at 05/16/2015  2:05 PM EDT ----- Recent studies have found that niacin only reduces heart attack risk by a few percentage points but pravastatin reduces heart attack risk by up to 40%... regardless of diet. I would only recommend staying on niacin if there are no statins that he can tolerate.

## 2015-05-16 NOTE — Telephone Encounter (Signed)
Patient advised as directed below. Patient agrees to start Pravastatin. RX sent to [facility].

## 2015-05-23 ENCOUNTER — Inpatient Hospital Stay: Payer: Medicare Other | Attending: Hematology and Oncology

## 2015-05-23 ENCOUNTER — Inpatient Hospital Stay: Payer: Medicare Other

## 2015-05-23 VITALS — BP 121/79 | HR 75 | Temp 97.2°F

## 2015-05-23 DIAGNOSIS — D751 Secondary polycythemia: Secondary | ICD-10-CM | POA: Diagnosis not present

## 2015-05-23 DIAGNOSIS — Z79899 Other long term (current) drug therapy: Secondary | ICD-10-CM | POA: Insufficient documentation

## 2015-05-23 LAB — HEMATOCRIT: HEMATOCRIT: 46 % (ref 40.0–52.0)

## 2015-07-13 IMAGING — CR DG FEMUR 2V*R*
1 series · 4 of 4 positions shown · non-contrast
Comparison: None

CLINICAL DATA: RIGHT leg pain, trauma, ran into a [place]w at [employer] 9
days ago

EXAM:
RIGHT FEMUR - 2 VIEW

[Series 1: kdxr femur right · 0.14mm/px · 4 of 4 slices shown]
[im 1/4]
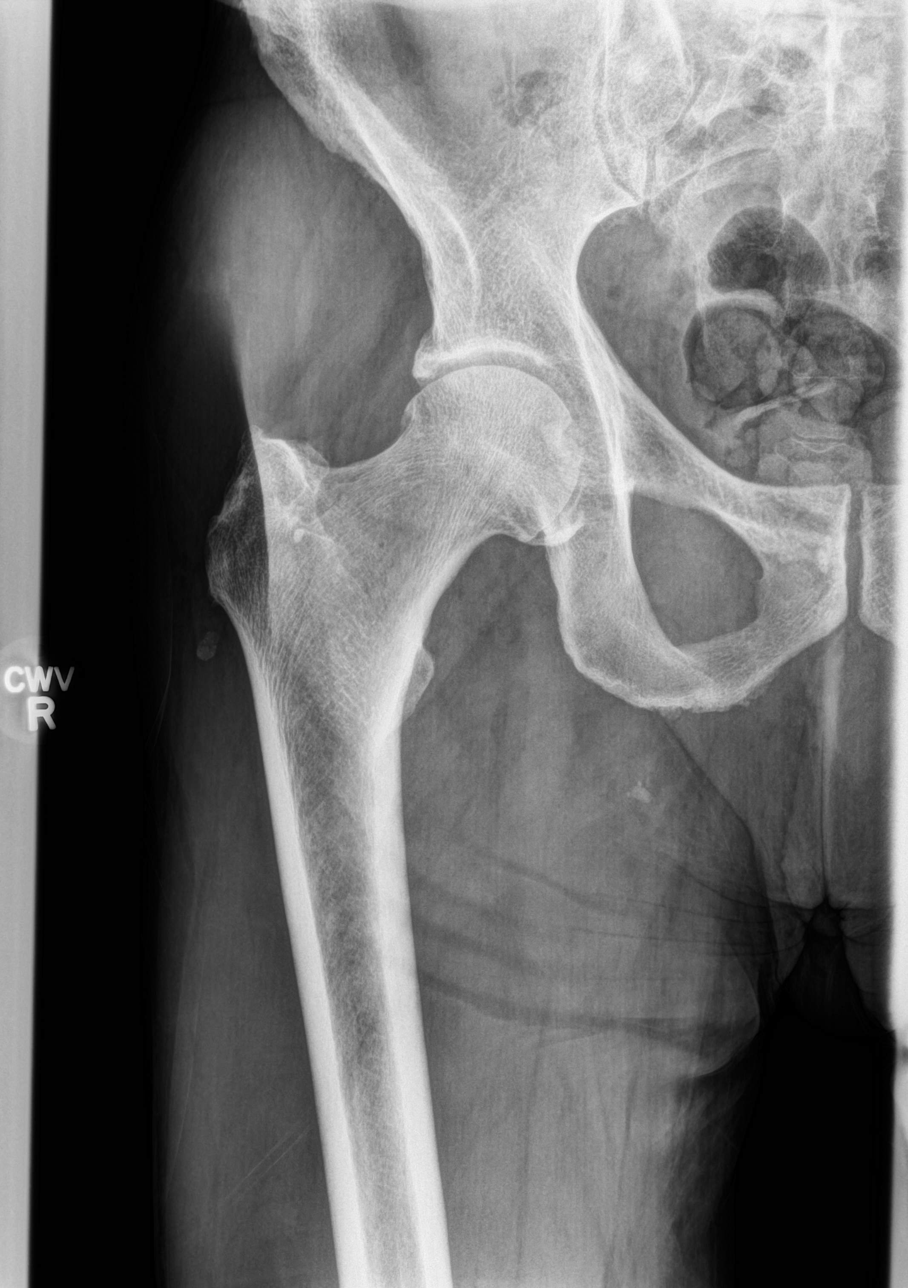
[im 2/4]
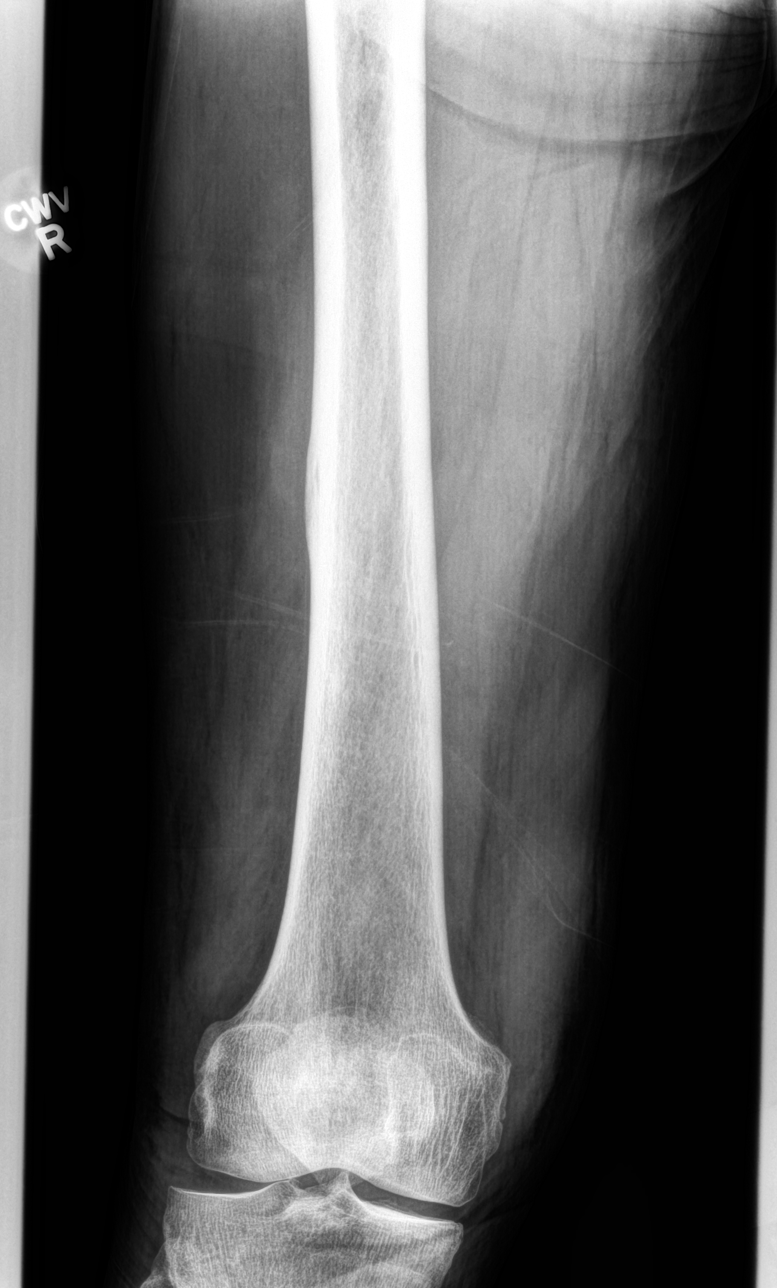
[im 3/4]
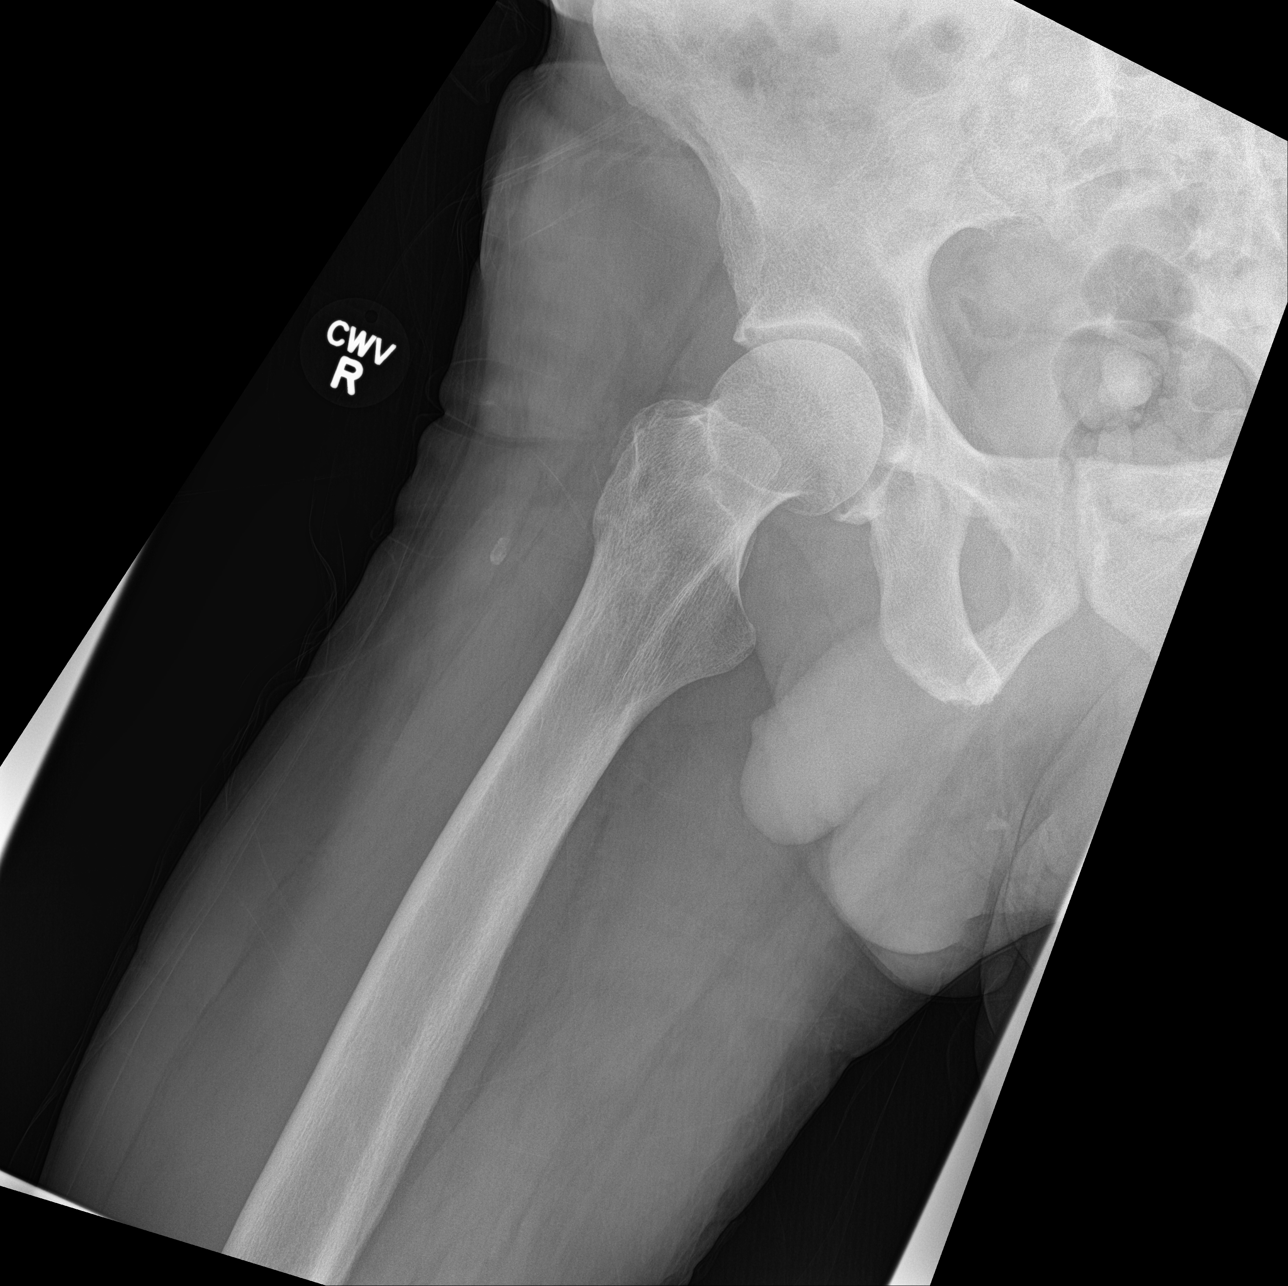
[im 4/4]
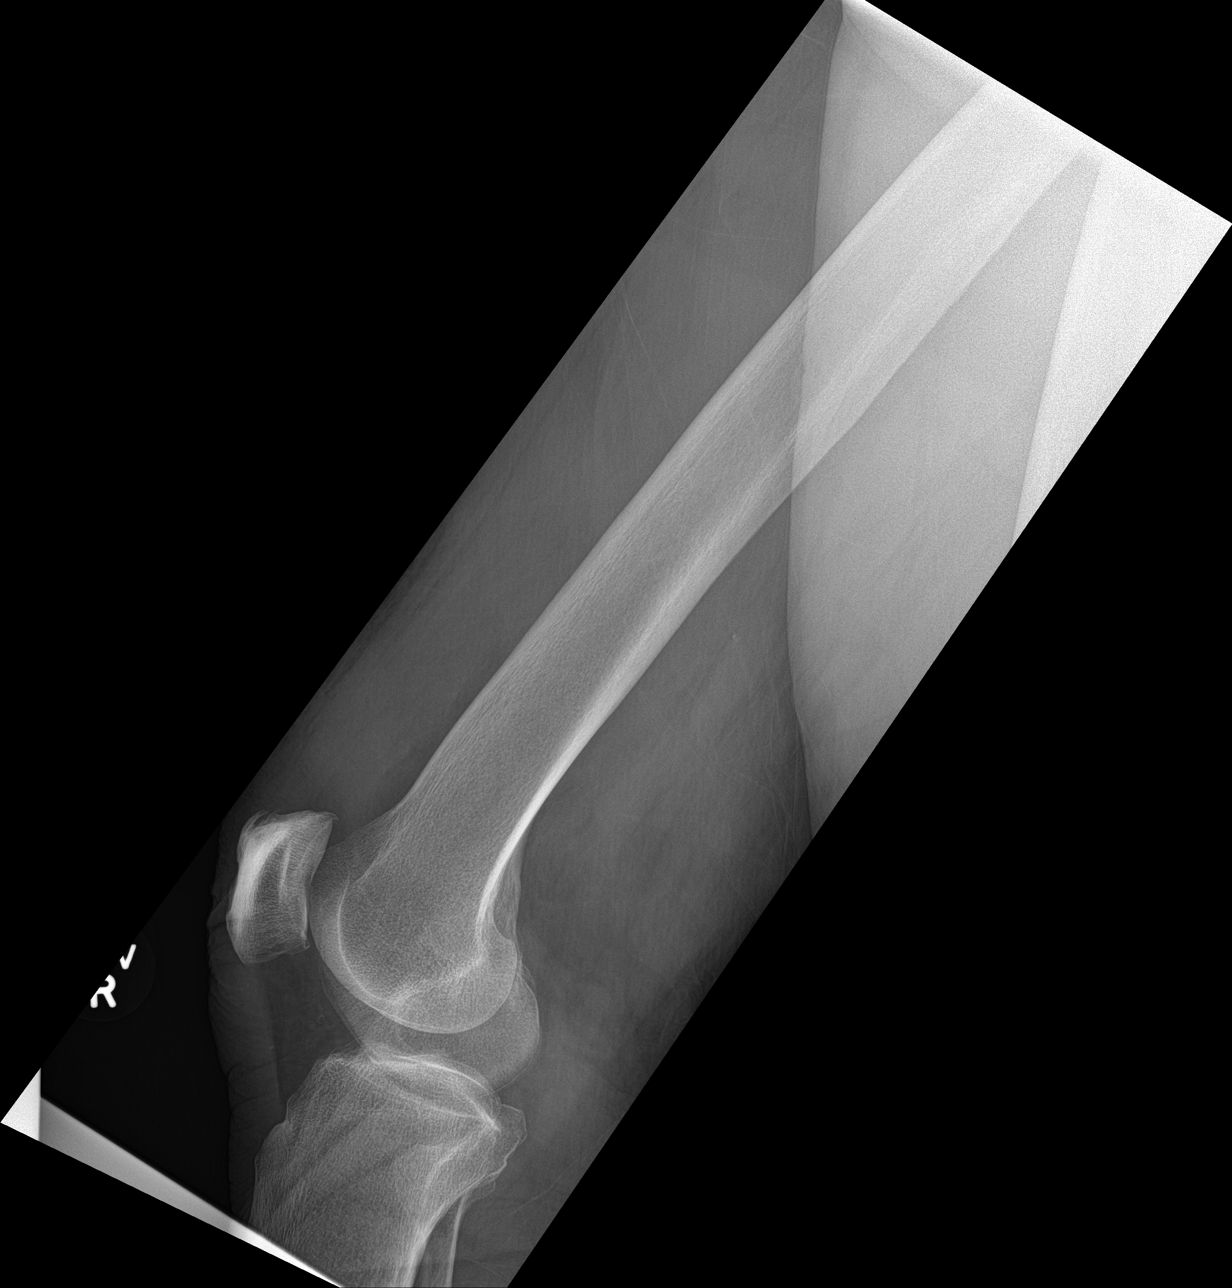

[4 of 4 positions shown; findings below may reference images not displayed]

FINDINGS: Visualized RIGHT pelvis intact.

Osseous mineralization mildly decreased.

No acute fracture, dislocation or bone destruction.
IMPRESSION: No acute osseous abnormalities.

## 2015-07-25 ENCOUNTER — Ambulatory Visit (INDEPENDENT_AMBULATORY_CARE_PROVIDER_SITE_OTHER): Payer: Medicare Other | Admitting: Family Medicine

## 2015-07-25 ENCOUNTER — Inpatient Hospital Stay: Payer: Medicare Other | Admitting: Internal Medicine

## 2015-07-25 ENCOUNTER — Inpatient Hospital Stay: Payer: Medicare Other | Attending: Internal Medicine

## 2015-07-25 ENCOUNTER — Inpatient Hospital Stay: Payer: Medicare Other

## 2015-07-25 VITALS — BP 122/86 | HR 80 | Temp 98.1°F | Resp 16 | Wt 195.0 lb

## 2015-07-25 DIAGNOSIS — D751 Secondary polycythemia: Secondary | ICD-10-CM | POA: Diagnosis not present

## 2015-07-25 DIAGNOSIS — L03115 Cellulitis of right lower limb: Secondary | ICD-10-CM

## 2015-07-25 LAB — CBC WITH DIFFERENTIAL/PLATELET
BASOS PCT: 1 %
Basophils Absolute: 0 10*3/uL (ref 0–0.1)
EOS ABS: 0.5 10*3/uL (ref 0–0.7)
EOS PCT: 6 %
HCT: 47.8 % (ref 40.0–52.0)
Hemoglobin: 16.7 g/dL (ref 13.0–18.0)
LYMPHS ABS: 1.3 10*3/uL (ref 1.0–3.6)
Lymphocytes Relative: 17 %
MCH: 32.7 pg (ref 26.0–34.0)
MCHC: 34.9 g/dL (ref 32.0–36.0)
MCV: 93.6 fL (ref 80.0–100.0)
MONOS PCT: 8 %
Monocytes Absolute: 0.6 10*3/uL (ref 0.2–1.0)
Neutro Abs: 5 10*3/uL (ref 1.4–6.5)
Neutrophils Relative %: 68 %
PLATELETS: 185 10*3/uL (ref 150–440)
RBC: 5.11 MIL/uL (ref 4.40–5.90)
RDW: 13.1 % (ref 11.5–14.5)
WBC: 7.3 10*3/uL (ref 3.8–10.6)

## 2015-07-25 MED ORDER — CEPHALEXIN 500 MG PO CAPS: 500.0000 mg | ORAL_CAPSULE | Freq: Four times a day (QID) | ORAL | Status: AC

## 2015-07-25 NOTE — Progress Notes (Signed)
         Patient: Patrick French Male    DOB: January 05, 1941   75 y.o.   MRN: [id number] Visit Date: 07/25/2015  Today's Provider: Lelon Huh, MD   Chief Complaint  Patient presents with  . Insect Bite   Subjective:    HPI   Pt comes in today reports he had three tick bites on his right leg.  Pt says his wife was able to get them all out from what they can tell.  Two of the bites seem to be healing well, however the third one may be infected.  The bite is swollen and tender, with a minimal amount of puss draining from it.  He reports having Lyme disease last year.       No Known Allergies Current Meds  Medication Sig  . aspirin 81 MG tablet Take 81 mg by mouth daily.   Marland Kitchen lisinopril (PRINIVIL,ZESTRIL) 10 MG tablet Take 1 tablet (10 mg total) by mouth daily.  . MULTIPLE VITAMIN PO Take 1 tablet by mouth daily.   . Omega-3 Fatty Acids (FISH OIL BURP-LESS) 1000 MG CAPS Take 1 capsule by mouth 2 (two) times daily.   . pravastatin (PRAVACHOL) 20 MG tablet Take 1 tablet (20 mg total) by mouth daily.  . sildenafil (REVATIO) 20 MG tablet 3-5 tablets as needed for ED. Do no take more than 5 tablets in a 24 hour period.  Marland Kitchen testosterone cypionate (DEPOTESTOSTERONE CYPIONATE) 200 MG/ML injection Inject 0.5 mLs (100 mg total) into the muscle every 14 (fourteen) days.    Review of Systems  Constitutional: Negative.   Gastrointestinal: Negative.   Musculoskeletal: Positive for neck pain (Has had neck pain since Sunday. ). Negative for myalgias, back pain, joint swelling, arthralgias, gait problem and neck stiffness.  Neurological: Negative for dizziness, light-headedness and headaches.  Hematological: Negative for adenopathy.    Social History  Substance Use Topics  . Smoking status: Former Smoker -- 2.00 packs/day for 13 years    Types: Cigarettes    Quit date: 01/10/1970  . Smokeless tobacco: Never Used  . Alcohol Use: 0.0 oz/week    0 Standard drinks or equivalent per week   Comment: occasional   Objective:   BP 122/86 mmHg  Pulse 80  Temp(Src) 98.1 F (36.7 C) (Oral)  Resp 16  Wt 195 lb (88.451 kg)  Physical Exam  General appearance: alert, well developed, well nourished, cooperative and in no distress Head: Normocephalic, without obvious abnormality, atraumatic  Extremities: about 2.5cm dm patch of tender erythema around site of tick bite posterior aspect of right lower leg. No discharge.  Skin: Skin color, texture, turgor normal. No rashes seen  Psych: Appropriate mood and affect.      Assessment & Plan:     1. Cellulitis of right lower extremity No systemic signs of symptoms of tick borne infection. Start cephalexin and call if not much better next week.  - cephALEXin (KEFLEX) 500 MG capsule; Take 1 capsule (500 mg total) by mouth 4 (four) times daily.  Dispense: 20 capsule; Refill: 0       Lelon Huh, MD  [facility]

## 2015-07-25 NOTE — Progress Notes (Signed)
While I was bringing the patient to infusion for phlebotomy he stated that Dr. B had discussed increasing his goal hct to less than 50 instead of the <46 that is ordered in the computer.  I spoke with Dr. B to inform him of patient's HCT 47.8 as well as what was discussed at last office visit and he have verbal order that patient does not require phlebotomy today.

## 2015-09-09 ENCOUNTER — Other Ambulatory Visit: Payer: Self-pay | Admitting: Family Medicine

## 2015-10-20 ENCOUNTER — Encounter: Payer: Self-pay | Admitting: Internal Medicine

## 2015-10-20 ENCOUNTER — Inpatient Hospital Stay: Payer: Medicare Other | Attending: Internal Medicine | Admitting: Internal Medicine

## 2015-10-20 DIAGNOSIS — Z87891 Personal history of nicotine dependence: Secondary | ICD-10-CM | POA: Diagnosis not present

## 2015-10-20 DIAGNOSIS — Q796 Ehlers-Danlos syndrome: Secondary | ICD-10-CM | POA: Diagnosis not present

## 2015-10-20 DIAGNOSIS — E291 Testicular hypofunction: Secondary | ICD-10-CM | POA: Diagnosis not present

## 2015-10-20 DIAGNOSIS — D751 Secondary polycythemia: Secondary | ICD-10-CM

## 2015-10-20 DIAGNOSIS — Z79899 Other long term (current) drug therapy: Secondary | ICD-10-CM | POA: Diagnosis not present

## 2015-10-20 DIAGNOSIS — Z86010 Personal history of colonic polyps: Secondary | ICD-10-CM | POA: Insufficient documentation

## 2015-10-20 NOTE — Assessment & Plan Note (Addendum)
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 On intermittent phlebotomy. Patient has not noted any significant improvement clinically pre and post phlebotomy.   # I discussed with the patient that we could stretch the phlebotomies to every 12 weeks; to keep the hematocrit less than 50.   # Phlebotomy 350 ml every 3 months/with hematocrit; follow-up with me in 6 months.

## 2015-10-20 NOTE — Progress Notes (Signed)
[facility] OFFICE PROGRESS NOTE  Patient Care Team: Birdie Sons, MD as PCP - General (Family Medicine) Cammie Sickle, MD as Consulting Physician (Hematology)   SUMMARY OF HEMATOLOGIC HISTORY:  # AUG 2014-  ERYTHROCYTOSIS sec to testosterone inc q 2w [epo-N; Jak-2 NEG]; recommend phlebotomy if hct > 50.   INTERVAL HISTORY:  75 year old male patient with above history of erythrocytosis likely from testosterone injections is here for follow-up. Patient has been getting phlebotomies every 6 weeks or so; sees no significant difference pre or post phlebotomies.   Patient denies having any headaches or vision changes. Appetite is good. Denies any weight loss or nausea vomiting. Denies any night sweats or fevers.  REVIEW OF SYSTEMS:  A complete 10 point review of system is done which is negative except mentioned above/history of present illness.   PAST MEDICAL HISTORY :  Past Medical History:  Diagnosis Date  . Ehlers-Danlos disease   . 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 History of colon polyps   . Testosterone deficiency     PAST SURGICAL HISTORY :   Past Surgical History:  Procedure Laterality Date  . APPENDECTOMY  1992  . CHOLECYSTECTOMY  1988  . SKIN LESION EXCISION  05/2012, 04/2014    FAMILY HISTORY :   Family History  Problem Relation Age of Onset  . Lung cancer Mother     died at age 33  . Pneumonia Father 86    died at age 44 from complication of pneumonia  . Ovarian cancer Sister     deceased from ovarian cancer  . Heart disease Sister     died at age 4 due to complications from heart surgery    SOCIAL HISTORY:   Social History  Substance Use Topics  . Smoking status: Former Smoker    Packs/day: 2.00    Years: 13.00    Types: Cigarettes    Quit date: 01/10/1970  . Smokeless tobacco: Never Used  . Alcohol use 0.0 oz/week     Comment: occasional    ALLERGIES:  has No Known  Allergies.  MEDICATIONS:  Current Outpatient Prescriptions  Medication Sig Dispense Refill  . aspirin 81 MG tablet Take 81 mg by mouth daily.     Marland Kitchen lisinopril (PRINIVIL,ZESTRIL) 10 MG tablet Take 1 tablet (10 mg total) by mouth daily. 30 tablet 12  . MULTIPLE VITAMIN PO Take 1 tablet by mouth daily.     . Omega-3 Fatty Acids (FISH OIL BURP-LESS) 1000 MG CAPS Take 1 capsule by mouth 2 (two) times daily.     . pravastatin (PRAVACHOL) 20 MG tablet TAKE 1 TABLET BY MOUTH DAILY 30 tablet 12  . sildenafil (REVATIO) 20 MG tablet 3-5 tablets as needed for ED. Do no take more than 5 tablets in a 24 hour period. 50 tablet 3  . testosterone cypionate (DEPOTESTOSTERONE CYPIONATE) 200 MG/ML injection Inject 0.5 mLs (100 mg total) into the muscle every 14 (fourteen) days. 10 mL 1   No current facility-administered medications for this visit.     PHYSICAL EXAMINATION:   BP (!) 134/95 (BP Location: Left Arm, Patient Position: Sitting)   Pulse 77   Temp (!) 96.5 F (35.8 C) (Tympanic)   Resp 17   Ht 6' 2.5" (1.892 m)   Wt 193 lb 3.2 oz (87.6 kg)   SpO2 94%   BMI 24.47 kg/m   Filed Weights   10/20/15 1422  Weight: 193 lb 3.2 oz (87.6 kg)  GENERAL: Well-nourished well-developed; Alert, no distress and comfortable.  Alone.  EYES: no pallor or icterus OROPHARYNX: no thrush or ulceration. NECK: supple, no masses felt LYMPH:  no palpable lymphadenopathy in the cervical, axillary or inguinal regions LUNGS: clear to auscultation and  No wheeze or crackles HEART/CVS: regular rate & rhythm and no murmurs; No lower extremity edema ABDOMEN:abdomen soft, non-tender and normal bowel sounds Musculoskeletal:no cyanosis of digits and no clubbing  PSYCH: alert & oriented x 3 with fluent speech NEURO: no focal motor/sensory deficits SKIN:  no rashes or significant lesions  LABORATORY DATA:  I have reviewed the data as listed    Component Value Date/Time   NA 139 05/12/2015 1026   K 4.8 05/12/2015  1026   CL 100 05/12/2015 1026   CO2 24 05/12/2015 1026   GLUCOSE 100 (H) 05/12/2015 1026   BUN 12 05/12/2015 1026   CREATININE 1.17 05/12/2015 1026   CREATININE 1.10 10/16/2012 1346   CALCIUM 9.3 05/12/2015 1026   PROT 6.9 05/12/2015 1026   PROT 7.1 10/16/2012 1346   ALBUMIN 4.3 05/12/2015 1026   ALBUMIN 4.4 05/12/2015 1026   ALBUMIN 3.8 10/16/2012 1346   AST 19 05/12/2015 1026   AST 19 10/16/2012 1346   ALT 16 05/12/2015 1026   ALT 27 10/16/2012 1346   ALKPHOS 91 05/12/2015 1026   ALKPHOS 92 10/16/2012 1346   BILITOT 0.5 05/12/2015 1026   BILITOT 0.6 10/16/2012 1346   GFRNONAA 61 05/12/2015 1026   GFRNONAA >60 10/16/2012 1346   GFRAA 70 05/12/2015 1026   GFRAA >60 10/16/2012 1346    No results found for: SPEP, UPEP  Lab Results  Component Value Date   WBC 7.3 07/25/2015   NEUTROABS 5.0 07/25/2015   HGB 16.7 07/25/2015   HCT 47.8 07/25/2015   MCV 93.6 07/25/2015   PLT 185 07/25/2015      Chemistry      Component Value Date/Time   NA 139 05/12/2015 1026   K 4.8 05/12/2015 1026   CL 100 05/12/2015 1026   CO2 24 05/12/2015 1026   BUN 12 05/12/2015 1026   CREATININE 1.17 05/12/2015 1026   CREATININE 1.10 10/16/2012 1346      Component Value Date/Time   CALCIUM 9.3 05/12/2015 1026   ALKPHOS 91 05/12/2015 1026   ALKPHOS 92 10/16/2012 1346   AST 19 05/12/2015 1026   AST 19 10/16/2012 1346   ALT 16 05/12/2015 1026   ALT 27 10/16/2012 1346   BILITOT 0.5 05/12/2015 1026   BILITOT 0.6 10/16/2012 1346       RADIOGRAPHIC STUDIES: I have personally reviewed the radiological images as listed and agreed with the findings in the report. No results found.   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 On intermittent phlebotomy. Patient has not noted any significant improvement clinically pre and post phlebotomy.   # I discussed with the patient that we could stretch the phlebotomies to every 12 weeks; to  keep the hematocrit less than 50.   # Phlebotomy 350 ml every 3 months/with hematocrit; follow-up with me in 6 months.     Cammie Sickle, MD 10/21/2015 8:37 AM

## 2015-10-24 ENCOUNTER — Inpatient Hospital Stay: Payer: Medicare Other

## 2015-10-24 DIAGNOSIS — D751 Secondary polycythemia: Secondary | ICD-10-CM | POA: Diagnosis not present

## 2015-10-24 DIAGNOSIS — Z79899 Other long term (current) drug therapy: Secondary | ICD-10-CM | POA: Diagnosis not present

## 2015-10-24 DIAGNOSIS — Z87891 Personal history of nicotine dependence: Secondary | ICD-10-CM | POA: Diagnosis not present

## 2015-10-24 DIAGNOSIS — Z86010 Personal history of colonic polyps: Secondary | ICD-10-CM | POA: Diagnosis not present

## 2015-10-24 DIAGNOSIS — E291 Testicular hypofunction: Secondary | ICD-10-CM | POA: Diagnosis not present

## 2015-10-24 DIAGNOSIS — Q796 Ehlers-Danlos syndrome: Secondary | ICD-10-CM | POA: Diagnosis not present

## 2015-10-24 LAB — CBC WITH DIFFERENTIAL/PLATELET
BASOS PCT: 1 %
Basophils Absolute: 0 10*3/uL (ref 0–0.1)
Eosinophils Absolute: 0.3 10*3/uL (ref 0–0.7)
Eosinophils Relative: 6 %
HEMATOCRIT: 48.4 % (ref 40.0–52.0)
HEMOGLOBIN: 16.9 g/dL (ref 13.0–18.0)
LYMPHS PCT: 25 %
Lymphs Abs: 1.4 10*3/uL (ref 1.0–3.6)
MCH: 32.6 pg (ref 26.0–34.0)
MCHC: 34.8 g/dL (ref 32.0–36.0)
MCV: 93.5 fL (ref 80.0–100.0)
MONO ABS: 0.6 10*3/uL (ref 0.2–1.0)
MONOS PCT: 11 %
NEUTROS ABS: 3.3 10*3/uL (ref 1.4–6.5)
NEUTROS PCT: 57 %
Platelets: 174 10*3/uL (ref 150–440)
RBC: 5.18 MIL/uL (ref 4.40–5.90)
RDW: 13.2 % (ref 11.5–14.5)
WBC: 5.8 10*3/uL (ref 3.8–10.6)

## 2015-12-09 ENCOUNTER — Other Ambulatory Visit: Payer: Self-pay | Admitting: *Deleted

## 2015-12-09 DIAGNOSIS — E291 Testicular hypofunction: Secondary | ICD-10-CM

## 2015-12-09 MED ORDER — TESTOSTERONE CYPIONATE 200 MG/ML IM SOLN: 100.0000 mg | INTRAMUSCULAR | 5 refills | Status: DC

## 2015-12-09 NOTE — Telephone Encounter (Signed)
Please call in testosterone.  

## 2015-12-10 NOTE — Telephone Encounter (Signed)
Rx called in to pharmacy. 

## 2015-12-30 ENCOUNTER — Telehealth: Payer: Self-pay | Admitting: Family Medicine

## 2015-12-30 NOTE — Telephone Encounter (Signed)
Called Pt to schedule AWV with [facility] for  - knb

## 2016-01-20 ENCOUNTER — Other Ambulatory Visit: Payer: Medicare Other

## 2016-01-23 ENCOUNTER — Inpatient Hospital Stay: Payer: Medicare Other

## 2016-01-23 ENCOUNTER — Inpatient Hospital Stay: Payer: Medicare Other | Attending: Internal Medicine

## 2016-01-23 VITALS — BP 125/79 | HR 85 | Temp 97.1°F | Resp 18

## 2016-01-23 DIAGNOSIS — D751 Secondary polycythemia: Secondary | ICD-10-CM | POA: Diagnosis not present

## 2016-01-23 DIAGNOSIS — Z79899 Other long term (current) drug therapy: Secondary | ICD-10-CM | POA: Diagnosis not present

## 2016-01-23 DIAGNOSIS — E291 Testicular hypofunction: Secondary | ICD-10-CM | POA: Insufficient documentation

## 2016-01-23 LAB — HEMOGLOBIN: Hemoglobin: 17 g/dL (ref 13.0–18.0)

## 2016-01-23 LAB — HEMATOCRIT: HCT: 49.4 % (ref 40.0–52.0)

## 2016-01-23 NOTE — Progress Notes (Signed)
According to last MD note on 10-20-15, phlebotomy parameters were changed to phlebotomy recommended for Hematocrit >50. Hct: 49.4 today. MD, Dr. Rogue Bussing, notified via telephone of Hematocrit and to clarify phlebotomy orders. Per MD order: follow supportive therapy treatment plan order and proceed with phlebotomy of 344ml today.

## 2016-03-12 ENCOUNTER — Telehealth: Payer: Self-pay | Admitting: Family Medicine

## 2016-03-12 NOTE — Telephone Encounter (Signed)
Called Pt to schedule AWV with [facility] - knb °

## 2016-03-30 ENCOUNTER — Encounter: Payer: Self-pay | Admitting: Family Medicine

## 2016-03-30 ENCOUNTER — Ambulatory Visit (INDEPENDENT_AMBULATORY_CARE_PROVIDER_SITE_OTHER): Payer: Medicare Other | Admitting: Family Medicine

## 2016-03-30 VITALS — BP 118/72 | HR 82 | Temp 97.9°F | Resp 16 | Ht 72.0 in | Wt 202.0 lb

## 2016-03-30 DIAGNOSIS — R05 Cough: Secondary | ICD-10-CM | POA: Diagnosis not present

## 2016-03-30 DIAGNOSIS — R059 Cough, unspecified: Secondary | ICD-10-CM

## 2016-03-30 MED ORDER — HYDROCODONE-HOMATROPINE 5-1.5 MG/5ML PO SYRP: 5.0000 mL | ORAL_SOLUTION | Freq: Three times a day (TID) | ORAL | 0 refills | Status: DC | PRN

## 2016-03-30 MED ORDER — MONTELUKAST SODIUM 10 MG PO TABS: 10.0000 mg | ORAL_TABLET | Freq: Every day | ORAL | 0 refills | Status: DC

## 2016-03-30 NOTE — Progress Notes (Signed)
Patient: Patrick French Male    DOB: 1940/06/16   76 y.o.   MRN: [id number] Visit Date: 03/30/2016  Today's Provider: Lelon Huh, MD   Chief Complaint  Patient presents with  . Cough   Subjective:    Cough  This is a new problem. The current episode started in the past 7 days (x 2 days). The problem has been gradually worsening. The cough is productive of sputum. Pertinent negatives include no fever, shortness of breath or wheezing. The symptoms are aggravated by lying down. He has tried OTC cough suppressant for the symptoms. The treatment provided no relief. There is no history of asthma or environmental allergies.       No Known Allergies   Current Outpatient Prescriptions:  .  aspirin 81 MG tablet, Take 81 mg by mouth daily. , Disp: , Rfl:  .  lisinopril (PRINIVIL,ZESTRIL) 10 MG tablet, Take 1 tablet (10 mg total) by mouth daily., Disp: 30 tablet, Rfl: 12 .  MULTIPLE VITAMIN PO, Take 1 tablet by mouth daily. , Disp: , Rfl:  .  Omega-3 Fatty Acids (FISH OIL BURP-LESS) 1000 MG CAPS, Take 1 capsule by mouth 2 (two) times daily. , Disp: , Rfl:  .  pravastatin (PRAVACHOL) 20 MG tablet, TAKE 1 TABLET BY MOUTH DAILY, Disp: 30 tablet, Rfl: 12 .  sildenafil (REVATIO) 20 MG tablet, 3-5 tablets as needed for ED. Do no take more than 5 tablets in a 24 hour period., Disp: 50 tablet, Rfl: 3 .  testosterone cypionate (DEPOTESTOSTERONE CYPIONATE) 200 MG/ML injection, Inject 0.5 mLs (100 mg total) into the muscle every 14 (fourteen) days., Disp: 10 mL, Rfl: 5  Review of Systems  Constitutional: Negative for fever.  Respiratory: Positive for cough. Negative for shortness of breath and wheezing.   Allergic/Immunologic: Negative for environmental allergies.    Social History  Substance Use Topics  . Smoking status: Former Smoker    Packs/day: 2.00    Years: 13.00    Types: Cigarettes    Quit date: 01/10/1970  . Smokeless tobacco: Never Used  . Alcohol use 0.0 oz/week   Comment: occasional   Objective:   BP 118/72 (BP Location: Left Arm, Patient Position: Sitting, Cuff Size: Normal)   Pulse 82   Temp 97.9 F (36.6 C)   Resp 16   Ht 6' (1.829 m)   Wt 202 lb (91.6 kg)   SpO2 98%   BMI 27.40 kg/m     Physical Exam  General Appearance:    Alert, cooperative, no distress  HENT:   bilateral TM normal without fluid or infection, neck without nodes, sinuses nontender, post nasal drip noted and nasal mucosa pale and congested  Eyes:    PERRL, conjunctiva/corneas clear, EOM's intact       Lungs:     Clear to auscultation bilaterally, respirations unlabored  Heart:    Regular rate and rhythm  Neurologic:   Awake, alert, oriented x 3. No apparent focal neurological           defect.           Assessment & Plan:     1. Cough Likely secondary to post nasal drainage.  - HYDROcodone-homatropine (HYCODAN) 5-1.5 MG/5ML syrup; Take 5 mLs by mouth every 8 (eight) hours as needed for cough.  Dispense: 120 mL; Refill: 0 - montelukast (SINGULAIR) 10 MG tablet; Take 1 tablet (10 mg total) by mouth at bedtime.  Dispense: 14 tablet; Refill: 0  Call if  symptoms change or if not rapidly improving.          Lelon Huh, MD  [facility]

## 2016-04-19 ENCOUNTER — Inpatient Hospital Stay: Payer: Medicare Other

## 2016-04-19 ENCOUNTER — Inpatient Hospital Stay: Payer: Medicare Other | Attending: Internal Medicine | Admitting: Internal Medicine

## 2016-04-19 DIAGNOSIS — Z79899 Other long term (current) drug therapy: Secondary | ICD-10-CM | POA: Insufficient documentation

## 2016-04-19 DIAGNOSIS — Z86010 Personal history of colonic polyps: Secondary | ICD-10-CM | POA: Diagnosis not present

## 2016-04-19 DIAGNOSIS — Q796 Ehlers-Danlos syndrome: Secondary | ICD-10-CM | POA: Insufficient documentation

## 2016-04-19 DIAGNOSIS — Z7982 Long term (current) use of aspirin: Secondary | ICD-10-CM | POA: Insufficient documentation

## 2016-04-19 DIAGNOSIS — D751 Secondary polycythemia: Secondary | ICD-10-CM | POA: Insufficient documentation

## 2016-04-19 DIAGNOSIS — Z87891 Personal history of nicotine dependence: Secondary | ICD-10-CM | POA: Insufficient documentation

## 2016-04-19 DIAGNOSIS — E291 Testicular hypofunction: Secondary | ICD-10-CM | POA: Insufficient documentation

## 2016-04-19 LAB — HEMOGLOBIN: HEMOGLOBIN: 17.2 g/dL (ref 13.0–18.0)

## 2016-04-19 LAB — HEMATOCRIT: HCT: 48.8 % (ref 40.0–52.0)

## 2016-04-19 NOTE — Progress Notes (Signed)
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 Patient has been getting phlebotomies every 3 months; sees no significant difference pre or post phlebotomies.  Appetite is good. Denies any weight loss or nausea vomiting. Denies any night sweats or fevers. Patient denies having any headaches or vision changes.  No history of blood clots.   REVIEW OF SYSTEMS:  A complete 10 point review of system is done which is negative except mentioned above/history of present illness.   PAST MEDICAL HISTORY :  Past Medical History:  Diagnosis Date  . Ehlers-Danlos disease   . 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 History of colon polyps   . Testosterone deficiency     PAST SURGICAL HISTORY :   Past Surgical History:  Procedure Laterality Date  . APPENDECTOMY  1992  . CHOLECYSTECTOMY  1988  . SKIN LESION EXCISION  05/2012, 04/2014    FAMILY HISTORY :   Family History  Problem Relation Age of Onset  . Lung cancer Mother     died at age 76  . Pneumonia Father 82    died at age 50 from complication of pneumonia  . Ovarian cancer Sister     deceased from ovarian cancer  . Heart disease Sister     died at age 3 due to complications from heart surgery    SOCIAL HISTORY:   Social History  Substance Use Topics  . Smoking status: Former Smoker    Packs/day: 2.00    Years: 13.00    Types: Cigarettes    Quit date: 01/10/1970  . Smokeless tobacco: Never Used  . Alcohol use 0.0 oz/week     Comment: occasional    ALLERGIES:  has No Known  Allergies.  MEDICATIONS:  Current Outpatient Prescriptions  Medication Sig Dispense Refill  . aspirin 81 MG tablet Take 81 mg by mouth daily.     Marland Kitchen HYDROcodone-homatropine (HYCODAN) 5-1.5 MG/5ML syrup Take 5 mLs by mouth every 8 (eight) hours as needed for cough. 120 mL 0  . lisinopril (PRINIVIL,ZESTRIL) 10 MG tablet Take 1 tablet (10 mg total) by mouth daily. 30 tablet 12  . MULTIPLE VITAMIN PO Take 1 tablet by mouth daily.     . Omega-3 Fatty Acids (FISH OIL BURP-LESS) 1000 MG CAPS Take 1 capsule by mouth 2 (two) times daily.     . pravastatin (PRAVACHOL) 20 MG tablet TAKE 1 TABLET BY MOUTH DAILY 30 tablet 12  . sildenafil (REVATIO) 20 MG tablet 3-5 tablets as needed for ED. Do no take more than 5 tablets in a 24 hour period. 50 tablet 3  . testosterone cypionate (DEPOTESTOSTERONE CYPIONATE) 200 MG/ML injection Inject 0.5 mLs (100 mg total) into the muscle every 14 (fourteen) days. 10 mL 5   No current facility-administered medications for this visit.     PHYSICAL EXAMINATION:   BP 106/69 (BP Location: Left Arm, Patient Position: Sitting)   Pulse 91   Temp 97.7 F (36.5 C) (Tympanic)   Resp 16   Wt 198 lb 6 oz (90 kg)   BMI 26.90 kg/m   Filed  Weights   04/19/16 1403  Weight: 198 lb 6 oz (90 kg)    GENERAL: Well-nourished well-developed; Alert, no distress and comfortable.  Alone.  EYES: no pallor or icterus OROPHARYNX: no thrush or ulceration. NECK: supple, no masses felt LYMPH:  no palpable lymphadenopathy in the cervical, axillary or inguinal regions LUNGS: clear to auscultation and  No wheeze or crackles HEART/CVS: regular rate & rhythm and no murmurs; No lower extremity edema ABDOMEN:abdomen soft, non-tender and normal bowel sounds Musculoskeletal:no cyanosis of digits and no clubbing  PSYCH: alert & oriented x 3 with fluent speech NEURO: no focal motor/sensory deficits SKIN:  no rashes or significant lesions  LABORATORY DATA:  I have reviewed the data as  listed    Component Value Date/Time   NA 139 05/12/2015 1026   K 4.8 05/12/2015 1026   CL 100 05/12/2015 1026   CO2 24 05/12/2015 1026   GLUCOSE 100 (H) 05/12/2015 1026   BUN 12 05/12/2015 1026   CREATININE 1.17 05/12/2015 1026   CREATININE 1.10 10/16/2012 1346   CALCIUM 9.3 05/12/2015 1026   PROT 6.9 05/12/2015 1026   PROT 7.1 10/16/2012 1346   ALBUMIN 4.3 05/12/2015 1026   ALBUMIN 4.4 05/12/2015 1026   ALBUMIN 3.8 10/16/2012 1346   AST 19 05/12/2015 1026   AST 19 10/16/2012 1346   ALT 16 05/12/2015 1026   ALT 27 10/16/2012 1346   ALKPHOS 91 05/12/2015 1026   ALKPHOS 92 10/16/2012 1346   BILITOT 0.5 05/12/2015 1026   BILITOT 0.6 10/16/2012 1346   GFRNONAA 61 05/12/2015 1026   GFRNONAA >60 10/16/2012 1346   GFRAA 70 05/12/2015 1026   GFRAA >60 10/16/2012 1346    No results found for: SPEP, UPEP  Lab Results  Component Value Date   WBC 5.8 10/24/2015   NEUTROABS 3.3 10/24/2015   HGB 17.2 04/19/2016   HCT 48.8 04/19/2016   MCV 93.5 10/24/2015   PLT 174 10/24/2015      Chemistry      Component Value Date/Time   NA 139 05/12/2015 1026   K 4.8 05/12/2015 1026   CL 100 05/12/2015 1026   CO2 24 05/12/2015 1026   BUN 12 05/12/2015 1026   CREATININE 1.17 05/12/2015 1026   CREATININE 1.10 10/16/2012 1346      Component Value Date/Time   CALCIUM 9.3 05/12/2015 1026   ALKPHOS 91 05/12/2015 1026   ALKPHOS 92 10/16/2012 1346   AST 19 05/12/2015 1026   AST 19 10/16/2012 1346   ALT 16 05/12/2015 1026   ALT 27 10/16/2012 1346   BILITOT 0.5 05/12/2015 1026   BILITOT 0.6 10/16/2012 1346       RADIOGRAPHIC STUDIES: I have personally reviewed the radiological images as listed and agreed with the findings in the report. No results found.   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 On intermittent phlebotomy. Patient continues to deny significant improvement clinically pre and post phlebotomy. Today  hemotocrit 48.8- hold phlebotomy.   # I discussed with the patient that we could stretch the phlebotomies to every 12 weeks; to keep the hematocrit less than 49.   # Phlebotomy 350 ml every 3 months/with hematocrit [if Hct > 49]; follow-up with me in 6 months.     Cammie Sickle, MD 04/19/2016 5:07 PM

## 2016-04-19 NOTE — Progress Notes (Signed)
Patient here today for follow up.  Patient states no new concerns today  

## 2016-04-19 NOTE — Assessment & Plan Note (Addendum)
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 On intermittent phlebotomy. Patient continues to deny significant improvement clinically pre and post phlebotomy. Today hemotocrit 48.8- hold phlebotomy.   # I discussed with the patient that we could stretch the phlebotomies to every 12 weeks; to keep the hematocrit less than 49.   # Phlebotomy 350 ml every 3 months/with hematocrit [if Hct > 49]; follow-up with me in 6 months.

## 2016-05-06 DIAGNOSIS — L820 Inflamed seborrheic keratosis: Secondary | ICD-10-CM | POA: Diagnosis not present

## 2016-05-06 DIAGNOSIS — Z85828 Personal history of other malignant neoplasm of skin: Secondary | ICD-10-CM | POA: Diagnosis not present

## 2016-05-06 DIAGNOSIS — L821 Other seborrheic keratosis: Secondary | ICD-10-CM | POA: Diagnosis not present

## 2016-05-06 DIAGNOSIS — D1800 Hemangioma unspecified site: Secondary | ICD-10-CM | POA: Diagnosis not present

## 2016-05-06 DIAGNOSIS — D229 Melanocytic nevi, unspecified: Secondary | ICD-10-CM | POA: Diagnosis not present

## 2016-05-06 DIAGNOSIS — I8393 Asymptomatic varicose veins of bilateral lower extremities: Secondary | ICD-10-CM | POA: Diagnosis not present

## 2016-05-06 DIAGNOSIS — L578 Other skin changes due to chronic exposure to nonionizing radiation: Secondary | ICD-10-CM | POA: Diagnosis not present

## 2016-05-06 DIAGNOSIS — D485 Neoplasm of uncertain behavior of skin: Secondary | ICD-10-CM | POA: Diagnosis not present

## 2016-05-06 DIAGNOSIS — Z1283 Encounter for screening for malignant neoplasm of skin: Secondary | ICD-10-CM | POA: Diagnosis not present

## 2016-05-14 ENCOUNTER — Ambulatory Visit (INDEPENDENT_AMBULATORY_CARE_PROVIDER_SITE_OTHER): Payer: Medicare Other

## 2016-05-14 ENCOUNTER — Ambulatory Visit (INDEPENDENT_AMBULATORY_CARE_PROVIDER_SITE_OTHER): Payer: Medicare Other | Admitting: Family Medicine

## 2016-05-14 VITALS — BP 138/78 | HR 62 | Temp 98.3°F | Ht 72.0 in | Wt 198.2 lb

## 2016-05-14 DIAGNOSIS — Z23 Encounter for immunization: Secondary | ICD-10-CM | POA: Diagnosis not present

## 2016-05-14 DIAGNOSIS — E291 Testicular hypofunction: Secondary | ICD-10-CM

## 2016-05-14 DIAGNOSIS — T148XXA Other injury of unspecified body region, initial encounter: Secondary | ICD-10-CM

## 2016-05-14 DIAGNOSIS — Z125 Encounter for screening for malignant neoplasm of prostate: Secondary | ICD-10-CM | POA: Diagnosis not present

## 2016-05-14 DIAGNOSIS — N529 Male erectile dysfunction, unspecified: Secondary | ICD-10-CM

## 2016-05-14 DIAGNOSIS — Z0000 Encounter for general adult medical examination without abnormal findings: Secondary | ICD-10-CM

## 2016-05-14 DIAGNOSIS — E7800 Pure hypercholesterolemia, unspecified: Secondary | ICD-10-CM

## 2016-05-14 DIAGNOSIS — I10 Essential (primary) hypertension: Secondary | ICD-10-CM | POA: Diagnosis not present

## 2016-05-14 MED ORDER — TADALAFIL 20 MG PO TABS: 20.0000 mg | ORAL_TABLET | Freq: Every day | ORAL | 3 refills | Status: AC | PRN

## 2016-05-14 NOTE — Progress Notes (Signed)
Patient: Patrick French Male    DOB: 1940/03/18   76 y.o.   MRN: [id number] Visit Date: 05/14/2016  Today's Provider: Lelon Huh, MD   Chief Complaint  Patient presents with  . Hypertension  . Hyperlipidemia  . Hypogonadism  . polycythemia   Subjective:    HPI  Hypertension, follow-up:  BP Readings from Last 3 Encounters:  05/14/16 138/78  04/19/16 106/69  03/30/16 118/72    He was last seen for hypertension 1 years ago.  BP at that visit was 124/84. Management since that visit includes no changes. He reports good compliance with treatment. He is not having side effects.  He is exercising. He is adherent to low salt diet.   Outside blood pressures are checked occasionally. He is experiencing none.  Patient denies exertional chest pressure/discomfort, lower extremity edema and palpitations.   Cardiovascular risk factors include dyslipidemia.    Weight trend: stable Wt Readings from Last 3 Encounters:  05/14/16 198 lb 3.2 oz (89.9 kg)  04/19/16 198 lb 6 oz (90 kg)  03/30/16 202 lb (91.6 kg)    Current diet: well balanced    Lipid/Cholesterol, Follow-up:   Last seen for this1 years ago.  Management changes since that visit include no changes. . Last Lipid Panel:    Component Value Date/Time   CHOL 192 05/12/2015 1026   TRIG 166 (H) 05/12/2015 1026   HDL 34 (L) 05/12/2015 1026   CHOLHDL 5.6 (H) 05/12/2015 1026   LDLCALC 125 (H) 05/12/2015 1026    Risk factors for vascular disease include hypertension  He reports good compliance with treatment. He is not having side effects.  Current symptoms include none and have been stable. Weight trend: stable Prior visit with dietician: no Current diet: well balanced Current exercise: walking  Wt Readings from Last 3 Encounters:  05/14/16 198 lb 3.2 oz (89.9 kg)  04/19/16 198 lb 6 oz (90 kg)  03/30/16 202 lb (91.6 kg)     Hypogonadism, follow up: Patient was last seen in the office 1 year ago.  No changes were made in his medications. Patient is currently taking the testosterone injections without difficulty.   He states that he continues to have trouble maintaining adequate erection for intercourse. We he takes sildenafil he will get an erection for a few minutes, but not long enough to enjoyable intercourse.      No Known Allergies   Current Outpatient Prescriptions:  .  aspirin 81 MG tablet, Take 81 mg by mouth daily. , Disp: , Rfl:  .  lisinopril (PRINIVIL,ZESTRIL) 10 MG tablet, Take 1 tablet (10 mg total) by mouth daily., Disp: 30 tablet, Rfl: 12 .  MULTIPLE VITAMIN PO, Take 1 tablet by mouth daily. , Disp: , Rfl:  .  Omega-3 Fatty Acids (FISH OIL BURP-LESS) 1000 MG CAPS, Take 1 capsule by mouth 2 (two) times daily. , Disp: , Rfl:  .  pravastatin (PRAVACHOL) 20 MG tablet, TAKE 1 TABLET BY MOUTH DAILY, Disp: 30 tablet, Rfl: 12 .  sildenafil (REVATIO) 20 MG tablet, 3-5 tablets as needed for ED. Do no take more than 5 tablets in a 24 hour period., Disp: 50 tablet, Rfl: 3 .  testosterone cypionate (DEPOTESTOSTERONE CYPIONATE) 200 MG/ML injection, Inject 0.5 mLs (100 mg total) into the muscle every 14 (fourteen) days., Disp: 10 mL, Rfl: 5  Review of Systems  Constitutional: Negative.   Respiratory: Negative.   Cardiovascular: Negative.   Gastrointestinal: Negative.   Endocrine: Negative.  Musculoskeletal: Positive for neck stiffness. Negative for arthralgias, back pain, gait problem, joint swelling, myalgias and neck pain.  Neurological: Negative.   Hematological: Bruises/bleeds easily.    Social History  Substance Use Topics  . Smoking status: Former Smoker    Packs/day: 2.00    Years: 13.00    Types: Cigarettes    Quit date: 01/10/1977  . Smokeless tobacco: Never Used  . Alcohol use 3.0 oz/week    5 Glasses of wine per week     Comment: occasional   Objective:   BP  138/78 (BP Location: Right Arm, Patient Position: Sitting)     Pulse  62     Temp  98.3  F (36.8 C)     Ht  6' (1.829 m)     Wt  198 lb 3.2 oz (89.9 kg)      BMI  26.88 kg/m       Physical Exam   General Appearance:    Alert, cooperative, no distress  Eyes:    PERRL, conjunctiva/corneas clear, EOM's intact       Lungs:     Clear to auscultation bilaterally, respirations unlabored  Heart:    Regular rate and rhythm  Neurologic:   Awake, alert, oriented x 3. No apparent focal neurological           defect.           Assessment & Plan:     1. Essential hypertension Well controlled.  Continue current medications.   - EKG 12-Lead  2. Hypogonadism in male  - Testosterone,Free and Total - TSH - PSA  3. Hypercholesterolemia without hypertriglyceridemia He is tolerating pravastatin well with no adverse effects.   - Comprehensive metabolic panel - Lipid panel  4. Prostate cancer screening  - PSA  5. Erectile dysfunction, unspecified erectile dysfunction type Inadequate response to sildenafil. Will try Cialis. Consider urology referral.  - tadalafil (CIALIS) 20 MG tablet; Take 1 tablet (20 mg total) by mouth daily as needed for erectile dysfunction.  Dispense: 5 tablet; Refill: 3       Lelon Huh, MD  [facility]

## 2016-05-14 NOTE — Progress Notes (Signed)
Subjective:   Patrick French is a 76 y.o. male who presents for Medicare Annual/Subsequent preventive examination.  Review of Systems:  N/A Cardiac Risk Factors include: dyslipidemia;hypertension;advanced age (>37men, >37 women)     Objective:    Vitals: BP 138/78 (BP Location: Right Arm, Patient Position: Sitting)   Pulse 62   Temp 98.3 F (36.8 C)   Ht 6' (1.829 m)   Wt 198 lb 3.2 oz (89.9 kg)   BMI 26.88 kg/m   Body mass index is 26.88 kg/m.  Tobacco History  Smoking Status  . Former Smoker  . Packs/day: 2.00  . Years: 13.00  . Types: Cigarettes  . Quit date: 01/10/1977  Smokeless Tobacco  . Never Used     Counseling given: Not Answered   Past Medical History:  Diagnosis Date  . Ehlers-Danlos disease   . 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 History of colon polyps   . Testosterone deficiency    Past Surgical History:  Procedure Laterality Date  . APPENDECTOMY  1992  . CHOLECYSTECTOMY  1988  . SKIN LESION EXCISION  05/2012, 04/2014   Family History  Problem Relation Age of Onset  . Lung cancer Mother     died at age 60  . Pneumonia Father 96    died at age 29 from complication of pneumonia  . Ovarian cancer Sister     deceased from ovarian cancer  . Heart disease Sister     died at age 40 due to complications from heart surgery   History  Sexual Activity  . Sexual activity: Not on file    Outpatient Encounter Prescriptions as of 05/14/2016  Medication Sig  . aspirin 81 MG tablet Take 81 mg by mouth daily.   Marland Kitchen lisinopril (PRINIVIL,ZESTRIL) 10 MG tablet Take 1 tablet (10 mg total) by mouth daily.  . MULTIPLE VITAMIN PO Take 1 tablet by mouth daily.   . Omega-3 Fatty Acids (FISH OIL BURP-LESS) 1000 MG CAPS Take 1 capsule by mouth 2 (two) times daily.   . pravastatin (PRAVACHOL) 20 MG tablet TAKE 1 TABLET BY MOUTH DAILY  . sildenafil (REVATIO) 20 MG tablet 3-5 tablets as needed for ED. Do no take more than 5 tablets  in a 24 hour period.  Marland Kitchen testosterone cypionate (DEPOTESTOSTERONE CYPIONATE) 200 MG/ML injection Inject 0.5 mLs (100 mg total) into the muscle every 14 (fourteen) days.  . [DISCONTINUED] HYDROcodone-homatropine (HYCODAN) 5-1.5 MG/5ML syrup Take 5 mLs by mouth every 8 (eight) hours as needed for cough. (Patient not taking: Reported on 05/14/2016)   No facility-administered encounter medications on file as of 05/14/2016.     Activities of Daily Living In your present state of health, do you have any difficulty performing the following activities: 05/14/2016  Hearing? N  Vision? N  Difficulty concentrating or making decisions? N  Walking or climbing stairs? N  Dressing or bathing? N  Doing errands, shopping? N  Preparing Food and eating ? N  Using the Toilet? N  In the past six months, have you accidently leaked urine? N  Do you have problems with loss of bowel control? N  Managing your Medications? N  Managing your Finances? N  Housekeeping or managing your Housekeeping? N  Some recent data might be hidden    Patient Care Team: Birdie Sons, MD as PCP - General (Family Medicine) Cammie Sickle, MD as Consulting Physician (Hematology)   Assessment:     Exercise Activities and Dietary recommendations Current  Exercise Habits: Home exercise routine, Time (Minutes): 30, Frequency (Times/Week): 5, Weekly Exercise (Minutes/Week): 150, Intensity: Mild  Goals    . Increase water intake          Recommend increasing water intake to 4 bottles a day.       Fall Risk Fall Risk  05/14/2016 05/12/2015  Falls in the past year? Yes No  Number falls in past yr: 1 -  Injury with Fall? No -  Follow up Falls prevention discussed -   Depression Screen PHQ 2/9 Scores 05/14/2016 05/14/2016 05/12/2015  PHQ - 2 Score 0 0 0  PHQ- 9 Score 0 0 -    Cognitive Function     6CIT Screen 05/14/2016  What Year? 0 points  What month? 0 points  What time? 0 points  Count back from 20 0 points  Months in  reverse 0 points  Repeat phrase 2 points  Total Score 2    Immunization History  Administered Date(s) Administered  . Influenza,inj,Quad PF,36+ Mos 10/10/2015  . Pneumococcal Conjugate-13 02/02/2013  . Pneumococcal Polysaccharide-23 05/12/2015   Screening Tests Health Maintenance  Topic Date Due  . INFLUENZA VACCINE  08/11/2016  . COLONOSCOPY  03/30/2018  . TETANUS/TDAP  05/15/2026  . PNA vac Low Risk Adult  Completed      Plan:     I have personally reviewed and noted the following in the patient's chart:   . Medical and social history . Use of alcohol, tobacco or illicit drugs  . Current medications and supplements . Functional ability and status . Nutritional status . Physical activity . Advanced directives . List of other physicians . Hospitalizations, surgeries, and ER visits in previous 12 months . Vitals . Screenings to include cognitive, depression, and falls . Referrals and appointments  In addition, I have reviewed and discussed with patient certain preventive protocols, quality metrics, and best practice recommendations. A written personalized care plan for preventive services as well as general preventive health recommendations were provided to patient.     [address] Tiffany A, LPN  01/16/8370  MD Recommendations: Patient requested Tdap to be done today, discucssed abrasion on toe.  I have reviewed the health advisor's note, was available for consultation, and agree with documentation and plan  Lelon Huh, MD

## 2016-05-14 NOTE — Patient Instructions (Addendum)
Mr. Patrick French , Thank you for taking time to come for your Medicare Wellness Visit. I appreciate your ongoing commitment to your health goals. Please review the following plan we discussed and let me know if I can assist you in the future.   Screening recommendations/referrals: Colonoscopy: Completed 03/30/2013, due 03/29/2023 Recommended yearly ophthalmology/optometry visit for glaucoma screening and checkup Recommended yearly dental visit for hygiene and checkup  Vaccinations: Influenza vaccine: completed 09/2015, due 09/2016 Pneumococcal vaccine: completed series Tdap vaccine: completed today, due in 2028 Shingles vaccine: due now, check with your insurance company for coverage  Advanced directives: Please bring a copy of your living will and health care power of attorney at your convenience.   Conditions/risks identified: Recommend increasing water intake to 4 bottles a day.   Next appointment: none, follow up in one year for your annual wellness visit   Preventive Care 65 Years and Older, Male Preventive care refers to lifestyle choices and visits with your health care provider that can promote health and wellness. What does preventive care include?  A yearly physical exam. This is also called an annual well check.  Dental exams once or twice a year.  Routine eye exams. Ask your health care provider how often you should have your eyes checked.  Personal lifestyle choices, including:  Daily care of your teeth and gums.  Regular physical activity.  Eating a healthy diet.  Avoiding tobacco and drug use.  Limiting alcohol use.  Practicing safe sex.  Taking low doses of aspirin every day.  Taking vitamin and mineral supplements as recommended by your health care provider. What happens during an annual well check? The services and screenings done by your health care provider during your annual well check will depend on your age, overall health, lifestyle risk factors, and  family history of disease. Counseling  Your health care provider may ask you questions about your:  Alcohol use.  Tobacco use.  Drug use.  Emotional well-being.  Home and relationship well-being.  Sexual activity.  Eating habits.  History of falls.  Memory and ability to understand (cognition).  Work and work Statistician. Screening  You may have the following tests or measurements:  Height, weight, and BMI.  Blood pressure.  Lipid and cholesterol levels. These may be checked every 5 years, or more frequently if you are over 71 years old.  Skin check.  Lung cancer screening. You may have this screening every year starting at age 43 if you have a 30-pack-year history of smoking and currently smoke or have quit within the past 15 years.  Fecal occult blood test (FOBT) of the stool. You may have this test every year starting at age 36.  Flexible sigmoidoscopy or colonoscopy. You may have a sigmoidoscopy every 5 years or a colonoscopy every 10 years starting at age 33.  Prostate cancer screening. Recommendations will vary depending on your family history and other risks.  Hepatitis C blood test.  Hepatitis B blood test.  Sexually transmitted disease (STD) testing.  Diabetes screening. This is done by checking your blood sugar (glucose) after you have not eaten for a while (fasting). You may have this done every 1-3 years.  Abdominal aortic aneurysm (AAA) screening. You may need this if you are a current or former smoker.  Osteoporosis. You may be screened starting at age 69 if you are at high risk. Talk with your health care provider about your test results, treatment options, and if necessary, the need for more tests. Vaccines  Your health care provider may recommend certain vaccines, such as:  Influenza vaccine. This is recommended every year.  Tetanus, diphtheria, and acellular pertussis (Tdap, Td) vaccine. You may need a Td booster every 10 years.  Zoster  vaccine. You may need this after age 66.  Pneumococcal 13-valent conjugate (PCV13) vaccine. One dose is recommended after age 21.  Pneumococcal polysaccharide (PPSV23) vaccine. One dose is recommended after age 70. Talk to your health care provider about which screenings and vaccines you need and how often you need them. This information is not intended to replace advice given to you by your health care provider. Make sure you discuss any questions you have with your health care provider. Document Released: 01/24/2015 Document Revised: 09/17/2015 Document Reviewed: 10/29/2014 Elsevier Interactive Patient Education  2017 [employer] Prevention in the Home Falls can cause injuries. They can happen to people of all ages. There are many things you can do to make your home safe and to help prevent falls. What can I do on the outside of my home?  Regularly fix the edges of walkways and driveways and fix any cracks.  Remove anything that might make you trip as you walk through a door, such as a raised step or threshold.  Trim any bushes or trees on the path to your home.  Use bright outdoor lighting.  Clear any walking paths of anything that might make someone trip, such as rocks or tools.  Regularly check to see if handrails are loose or broken. Make sure that both sides of any steps have handrails.  Any raised decks and porches should have guardrails on the edges.  Have any leaves, snow, or ice cleared regularly.  Use sand or salt on walking paths during winter.  Clean up any spills in your garage right away. This includes oil or grease spills. What can I do in the bathroom?  Use night lights.  Install grab bars by the toilet and in the tub and shower. Do not use towel bars as grab bars.  Use non-skid mats or decals in the tub or shower.  If you need to sit down in the shower, use a plastic, non-slip stool.  Keep the floor dry. Clean up any water that spills on the  floor as soon as it happens.  Remove soap buildup in the tub or shower regularly.  Attach bath mats securely with double-sided non-slip rug tape.  Do not have throw rugs and other things on the floor that can make you trip. What can I do in the bedroom?  Use night lights.  Make sure that you have a light by your bed that is easy to reach.  Do not use any sheets or blankets that are too big for your bed. They should not hang down onto the floor.  Have a firm chair that has side arms. You can use this for support while you get dressed.  Do not have throw rugs and other things on the floor that can make you trip. What can I do in the kitchen?  Clean up any spills right away.  Avoid walking on wet floors.  Keep items that you use a lot in easy-to-reach places.  If you need to reach something above you, use a strong step stool that has a grab bar.  Keep electrical cords out of the way.  Do not use floor polish or wax that makes floors slippery. If you must use wax, use non-skid floor wax.  Do  not have throw rugs and other things on the floor that can make you trip. What can I do with my stairs?  Do not leave any items on the stairs.  Make sure that there are handrails on both sides of the stairs and use them. Fix handrails that are broken or loose. Make sure that handrails are as long as the stairways.  Check any carpeting to make sure that it is firmly attached to the stairs. Fix any carpet that is loose or worn.  Avoid having throw rugs at the top or bottom of the stairs. If you do have throw rugs, attach them to the floor with carpet tape.  Make sure that you have a light switch at the top of the stairs and the bottom of the stairs. If you do not have them, ask someone to add them for you. What else can I do to help prevent falls?  Wear shoes that:  Do not have high heels.  Have rubber bottoms.  Are comfortable and fit you well.  Are closed at the toe. Do not wear  sandals.  If you use a stepladder:  Make sure that it is fully opened. Do not climb a closed stepladder.  Make sure that both sides of the stepladder are locked into place.  Ask someone to hold it for you, if possible.  Clearly mark and make sure that you can see:  Any grab bars or handrails.  First and last steps.  Where the edge of each step is.  Use tools that help you move around (mobility aids) if they are needed. These include:  Canes.  Walkers.  Scooters.  Crutches.  Turn on the lights when you go into a dark area. Replace any light bulbs as soon as they burn out.  Set up your furniture so you have a clear path. Avoid moving your furniture around.  If any of your floors are uneven, fix them.  If there are any pets around you, be aware of where they are.  Review your medicines with your doctor. Some medicines can make you feel dizzy. This can increase your chance of falling. Ask your doctor what other things that you can do to help prevent falls. This information is not intended to replace advice given to you by your health care provider. Make sure you discuss any questions you have with your health care provider. Document Released: 10/24/2008 Document Revised: 06/05/2015 Document Reviewed: 02/01/2014 Elsevier Interactive Patient Education  2017 [employer].

## 2016-05-16 LAB — COMPREHENSIVE METABOLIC PANEL
A/G RATIO: 1.8 (ref 1.2–2.2)
ALBUMIN: 4.7 g/dL (ref 3.5–4.8)
ALK PHOS: 92 IU/L (ref 39–117)
ALT: 26 IU/L (ref 0–44)
AST: 25 IU/L (ref 0–40)
BUN / CREAT RATIO: 17 (ref 10–24)
BUN: 19 mg/dL (ref 8–27)
Bilirubin Total: 1.1 mg/dL (ref 0.0–1.2)
CALCIUM: 9.7 mg/dL (ref 8.6–10.2)
CO2: 25 mmol/L (ref 18–29)
Chloride: 100 mmol/L (ref 96–106)
Creatinine, Ser: 1.14 mg/dL (ref 0.76–1.27)
GFR, EST AFRICAN AMERICAN: 72 mL/min/{1.73_m2} (ref >59)
GFR, EST NON AFRICAN AMERICAN: 62 mL/min/{1.73_m2} (ref >59)
GLOBULIN, TOTAL: 2.6 g/dL (ref 1.5–4.5)
Glucose: 110 mg/dL — ABNORMAL HIGH (ref 65–99)
Potassium: 5.2 mmol/L (ref 3.5–5.2)
Sodium: 141 mmol/L (ref 134–144)
Total Protein: 7.3 g/dL (ref 6.0–8.5)

## 2016-05-16 LAB — TSH: TSH: 2.42 u[IU]/mL (ref 0.450–4.500)

## 2016-05-16 LAB — TESTOSTERONE,FREE AND TOTAL
TESTOSTERONE FREE: 3.9 pg/mL — AB (ref 6.6–18.1)
TESTOSTERONE: 156 ng/dL — AB (ref 264–916)

## 2016-05-16 LAB — LIPID PANEL
Chol/HDL Ratio: 4.7 ratio (ref 0.0–5.0)
Cholesterol, Total: 173 mg/dL (ref 100–199)
HDL: 37 mg/dL — ABNORMAL LOW (ref >39)
LDL CALC: 111 mg/dL — AB (ref 0–99)
Triglycerides: 125 mg/dL (ref 0–149)
VLDL CHOLESTEROL CAL: 25 mg/dL (ref 5–40)

## 2016-05-16 LAB — PSA: Prostate Specific Ag, Serum: 1.1 ng/mL (ref 0.0–4.0)

## 2016-05-17 ENCOUNTER — Telehealth: Payer: Self-pay | Admitting: *Deleted

## 2016-05-17 ENCOUNTER — Other Ambulatory Visit: Payer: Self-pay | Admitting: Family Medicine

## 2016-05-17 DIAGNOSIS — E7800 Pure hypercholesterolemia, unspecified: Secondary | ICD-10-CM

## 2016-05-17 DIAGNOSIS — N529 Male erectile dysfunction, unspecified: Secondary | ICD-10-CM

## 2016-05-17 MED ORDER — PRAVASTATIN SODIUM 40 MG PO TABS: 40.0000 mg | ORAL_TABLET | Freq: Every day | ORAL | 5 refills | Status: DC

## 2016-05-17 NOTE — Telephone Encounter (Signed)
-----   Message from Birdie Sons, MD sent at 05/17/2016  8:58 AM EDT ----- LDL cholesterol is 111, should be under 100. Need to increase pravastatin to 40mg  daily, #30, rf x 5. Testosterone levels are slightly low, would continue same dose of testosterone injections. Rest of labs including kidney and liver functions are normal. Recheck lipids in 3 months.

## 2016-05-17 NOTE — Telephone Encounter (Signed)
Patient was notified of results. Expressed understanding. Rx sent to pharmacy. 

## 2016-06-09 ENCOUNTER — Other Ambulatory Visit: Payer: Self-pay | Admitting: Family Medicine

## 2016-06-09 DIAGNOSIS — I10 Essential (primary) hypertension: Secondary | ICD-10-CM

## 2016-06-11 ENCOUNTER — Other Ambulatory Visit: Payer: Self-pay | Admitting: Family Medicine

## 2016-06-11 DIAGNOSIS — E291 Testicular hypofunction: Secondary | ICD-10-CM

## 2016-06-11 NOTE — Telephone Encounter (Signed)
RX called in at [place]

## 2016-06-11 NOTE — Telephone Encounter (Signed)
Please call in testosterone.  

## 2016-07-19 DIAGNOSIS — Q796 Ehlers-Danlos syndrome: Secondary | ICD-10-CM | POA: Diagnosis not present

## 2016-07-19 DIAGNOSIS — E291 Testicular hypofunction: Secondary | ICD-10-CM | POA: Diagnosis not present

## 2016-07-23 ENCOUNTER — Other Ambulatory Visit: Payer: Medicare Other

## 2016-08-16 ENCOUNTER — Telehealth: Payer: Self-pay | Admitting: Family Medicine

## 2016-08-16 NOTE — Telephone Encounter (Signed)
Please advise it is time to recheck lipids and met c since we increase dose of pravastatin in May. Please leave order at front desk. Needs to be fasting.

## 2016-08-16 NOTE — Telephone Encounter (Signed)
Tried calling patient at both numbers listed and they are both disconnected. Will send a letter saying that it is time to have labs rechecked.

## 2016-09-15 DIAGNOSIS — H43812 Vitreous degeneration, left eye: Secondary | ICD-10-CM | POA: Diagnosis not present

## 2016-09-15 DIAGNOSIS — Z961 Presence of intraocular lens: Secondary | ICD-10-CM | POA: Diagnosis not present

## 2016-10-13 DIAGNOSIS — I10 Essential (primary) hypertension: Secondary | ICD-10-CM | POA: Diagnosis not present

## 2016-10-13 DIAGNOSIS — Z23 Encounter for immunization: Secondary | ICD-10-CM | POA: Diagnosis not present

## 2016-10-13 DIAGNOSIS — E291 Testicular hypofunction: Secondary | ICD-10-CM | POA: Diagnosis not present

## 2016-10-14 DIAGNOSIS — E291 Testicular hypofunction: Secondary | ICD-10-CM | POA: Diagnosis not present

## 2016-10-14 DIAGNOSIS — I10 Essential (primary) hypertension: Secondary | ICD-10-CM | POA: Diagnosis not present

## 2016-10-22 ENCOUNTER — Other Ambulatory Visit: Payer: Medicare Other

## 2016-10-22 ENCOUNTER — Ambulatory Visit: Payer: Medicare Other | Admitting: Internal Medicine

## 2016-11-01 ENCOUNTER — Other Ambulatory Visit: Payer: Self-pay | Admitting: Family Medicine

## 2016-11-01 DIAGNOSIS — E7800 Pure hypercholesterolemia, unspecified: Secondary | ICD-10-CM

## 2017-01-14 DIAGNOSIS — Z87891 Personal history of nicotine dependence: Secondary | ICD-10-CM | POA: Diagnosis not present

## 2017-01-14 DIAGNOSIS — J40 Bronchitis, not specified as acute or chronic: Secondary | ICD-10-CM | POA: Diagnosis not present

## 2017-01-14 DIAGNOSIS — R05 Cough: Secondary | ICD-10-CM | POA: Diagnosis not present

## 2017-01-26 DIAGNOSIS — I10 Essential (primary) hypertension: Secondary | ICD-10-CM | POA: Diagnosis not present

## 2017-01-26 DIAGNOSIS — E349 Endocrine disorder, unspecified: Secondary | ICD-10-CM | POA: Diagnosis not present

## 2017-01-26 DIAGNOSIS — N529 Male erectile dysfunction, unspecified: Secondary | ICD-10-CM | POA: Diagnosis not present

## 2017-01-26 DIAGNOSIS — R350 Frequency of micturition: Secondary | ICD-10-CM | POA: Diagnosis not present

## 2017-01-26 DIAGNOSIS — Z87891 Personal history of nicotine dependence: Secondary | ICD-10-CM | POA: Diagnosis not present

## 2017-01-26 DIAGNOSIS — E7800 Pure hypercholesterolemia, unspecified: Secondary | ICD-10-CM | POA: Diagnosis not present

## 2017-01-26 DIAGNOSIS — D649 Anemia, unspecified: Secondary | ICD-10-CM | POA: Diagnosis not present

## 2017-01-26 DIAGNOSIS — Z6825 Body mass index (BMI) 25.0-25.9, adult: Secondary | ICD-10-CM | POA: Diagnosis not present

## 2017-02-10 DIAGNOSIS — M549 Dorsalgia, unspecified: Secondary | ICD-10-CM | POA: Diagnosis not present

## 2017-02-10 DIAGNOSIS — M542 Cervicalgia: Secondary | ICD-10-CM | POA: Diagnosis not present

## 2017-03-30 DIAGNOSIS — R3914 Feeling of incomplete bladder emptying: Secondary | ICD-10-CM | POA: Diagnosis not present

## 2017-03-30 DIAGNOSIS — N400 Enlarged prostate without lower urinary tract symptoms: Secondary | ICD-10-CM | POA: Diagnosis not present

## 2017-04-12 DIAGNOSIS — N529 Male erectile dysfunction, unspecified: Secondary | ICD-10-CM | POA: Diagnosis not present

## 2017-04-25 ENCOUNTER — Telehealth: Payer: Self-pay

## 2017-04-25 NOTE — Telephone Encounter (Signed)
Both of pts listed numbers were unavailable. LMTCB on sons VM (ok per DPR). Need to schedule AWV and CPE. -MM

## 2017-05-03 NOTE — Telephone Encounter (Signed)
Pt returned call. Pt has moved to [place] and has a PCP there. Thanks TNP

## 2017-05-03 NOTE — Telephone Encounter (Signed)
Noted, removed from the list. Thank you, MM

## 2017-06-25 ENCOUNTER — Other Ambulatory Visit: Payer: Self-pay | Admitting: Family Medicine

## 2017-06-25 DIAGNOSIS — N529 Male erectile dysfunction, unspecified: Secondary | ICD-10-CM

## 2017-07-11 DEATH — deceased

## 2017-08-13 ENCOUNTER — Other Ambulatory Visit: Payer: Self-pay | Admitting: Family Medicine

## 2017-08-13 DIAGNOSIS — I10 Essential (primary) hypertension: Secondary | ICD-10-CM
